# Patient Record
Sex: Male | Born: 1940 | Hispanic: Yes | Marital: Married | Smoking: Former smoker
Health system: Southern US, Community
[De-identification: ages and names within clinical notes are randomized; demographics above are authoritative.]

## PROBLEM LIST (undated history)

## (undated) DIAGNOSIS — M1990 Unspecified osteoarthritis, unspecified site: Secondary | ICD-10-CM

## (undated) DIAGNOSIS — E785 Hyperlipidemia, unspecified: Secondary | ICD-10-CM

## (undated) DIAGNOSIS — J449 Chronic obstructive pulmonary disease, unspecified: Secondary | ICD-10-CM

## (undated) DIAGNOSIS — I10 Essential (primary) hypertension: Secondary | ICD-10-CM

## (undated) HISTORY — DX: Essential (primary) hypertension: I10

## (undated) HISTORY — DX: Unspecified osteoarthritis, unspecified site: M19.90

## (undated) HISTORY — DX: Hyperlipidemia, unspecified: E78.5

## (undated) HISTORY — DX: Chronic obstructive pulmonary disease, unspecified: J44.9

## (undated) HISTORY — PX: TONSILLECTOMY: SUR1361

## (undated) HISTORY — PX: CHOLECYSTECTOMY: SHX55

---

## 2015-06-12 LAB — PULMONARY FUNCTION TEST

## 2016-07-05 LAB — PULMONARY FUNCTION TEST

## 2018-08-28 LAB — PULMONARY FUNCTION TEST

## 2019-10-05 LAB — HM COLONOSCOPY

## 2020-10-13 LAB — PULMONARY FUNCTION TEST

## 2021-01-23 ENCOUNTER — Encounter: Payer: Self-pay | Admitting: *Deleted

## 2021-01-23 DIAGNOSIS — I10 Essential (primary) hypertension: Secondary | ICD-10-CM | POA: Insufficient documentation

## 2021-01-23 DIAGNOSIS — E785 Hyperlipidemia, unspecified: Secondary | ICD-10-CM | POA: Insufficient documentation

## 2021-01-23 DIAGNOSIS — M1990 Unspecified osteoarthritis, unspecified site: Secondary | ICD-10-CM

## 2021-01-23 DIAGNOSIS — E7800 Pure hypercholesterolemia, unspecified: Secondary | ICD-10-CM

## 2021-01-23 DIAGNOSIS — J449 Chronic obstructive pulmonary disease, unspecified: Secondary | ICD-10-CM | POA: Insufficient documentation

## 2021-01-26 ENCOUNTER — Encounter: Payer: Self-pay | Admitting: Adult Health

## 2021-01-26 ENCOUNTER — Other Ambulatory Visit: Payer: Self-pay

## 2021-01-26 ENCOUNTER — Non-Acute Institutional Stay: Payer: Medicare Other | Admitting: Adult Health

## 2021-01-26 VITALS — BP 124/80 | HR 77 | Temp 97.4°F | Wt 220.6 lb

## 2021-01-26 DIAGNOSIS — M1990 Unspecified osteoarthritis, unspecified site: Secondary | ICD-10-CM

## 2021-01-26 DIAGNOSIS — E7800 Pure hypercholesterolemia, unspecified: Secondary | ICD-10-CM

## 2021-01-26 DIAGNOSIS — J449 Chronic obstructive pulmonary disease, unspecified: Secondary | ICD-10-CM | POA: Diagnosis not present

## 2021-01-26 DIAGNOSIS — I10 Essential (primary) hypertension: Secondary | ICD-10-CM

## 2021-01-26 DIAGNOSIS — R7303 Prediabetes: Secondary | ICD-10-CM | POA: Insufficient documentation

## 2021-01-26 DIAGNOSIS — Q6102 Congenital multiple renal cysts: Secondary | ICD-10-CM

## 2021-01-26 DIAGNOSIS — N400 Enlarged prostate without lower urinary tract symptoms: Secondary | ICD-10-CM

## 2021-01-26 DIAGNOSIS — R911 Solitary pulmonary nodule: Secondary | ICD-10-CM | POA: Diagnosis not present

## 2021-01-26 HISTORY — DX: Congenital multiple renal cysts: Q61.02

## 2021-01-26 HISTORY — DX: Solitary pulmonary nodule: R91.1

## 2021-01-26 HISTORY — DX: Prediabetes: R73.03

## 2021-01-26 NOTE — Progress Notes (Signed)
Location:  [facility]:  Clinic  Provider:  Cindi Carbon, [place] [phone]   Code Status: Full code  Goals of Care:  Advanced Directives 01/23/2021  Does Patient Have a Medical Advance Directive? Yes  Type of Paramedic of [place];Living will  Does patient want to make changes to medical advance directive? No - Patient declined     Chief Complaint  Patient presents with  . Medical Management of Chronic Issues    Patient here today to establish care.     HPI: Patient is a 80 y.o. male seen today for medical management of chronic diseases.    Moved into a [place] in [place] April 13th from [place] to live closer to family. He is here to establish care but has no acute complaints.   Wants to find a pulmonologist here. Stated that his prior pulmonologist had done a work up for worsening dyspnea/decreasing FEV1. He had an echo that was "normal" by his account. He was due for a sleep study next. He does report that he has been told he snores. He is not sleeping well since he moved to [place]. He is a former smoker 2 ppd x 44 years. Denies a cough or PND. Does have DOE as mentioned above. No wheezing. Does not need prn inhalers.  Also followed for a lung nodule (still need records)  BP well controlled.   Has BPH and wants his PSA checked. Symptoms controlled with flomax Has 3 cysts on his right kidney followed by Urology Dr. Sallee Lange   Has some chronic neck pain (cervical area with burning and pain at times in the shoulder area but no pain down the arm and no numbness, tingling or loss of function) and left knee pain due to arthritis and was told he would need a replacement one day. At home he takes ibuprofen as needed for relief.     Past Medical History:  Diagnosis Date  . Arthritis   . COPD (chronic obstructive pulmonary disease) ([place])   . Hyperlipidemia   . Hypertension   . Lung nodule  01/26/2021  . Multiple renal cysts 01/26/2021   Right kidney  . Prediabetes 01/26/2021    Past Surgical History:  Procedure Laterality Date  . CHOLECYSTECTOMY    . TONSILLECTOMY      Allergies  Allergen Reactions  . Sulfa Antibiotics     Outpatient Encounter Medications as of 01/26/2021  Medication Sig  . amLODipine (NORVASC) 10 MG tablet Take 10 mg by mouth daily.  Marland Kitchen aspirin EC 81 MG tablet Take 81 mg by mouth daily. Swallow whole.  Marland Kitchen atorvastatin (LIPITOR) 10 MG tablet Take 10 mg by mouth daily.  . finasteride (PROSCAR) 5 MG tablet Take 5 mg by mouth every other day.  . fluticasone furoate-vilanterol (BREO ELLIPTA) 100-25 MCG/INH AEPB Inhale 1 puff into the lungs daily.  . hydrochlorothiazide (MICROZIDE) 12.5 MG capsule Take 12.5 mg by mouth daily.  Marland Kitchen losartan (COZAAR) 100 MG tablet Take 100 mg by mouth daily.  . tamsulosin (FLOMAX) 0.4 MG CAPS capsule Take 0.4 mg by mouth daily.   No facility-administered encounter medications on file as of 01/26/2021.    Review of Systems:  Review of Systems  Constitutional: Negative for activity change, appetite change, chills, diaphoresis, fatigue, fever and unexpected weight change.  HENT: Positive for hearing loss. Negative for congestion.   Respiratory: Positive for shortness of breath (on exertion). Negative for cough, wheezing and  stridor.   Cardiovascular: Negative for chest pain, palpitations and leg swelling.  Gastrointestinal: Positive for constipation. Negative for abdominal distention, abdominal pain and diarrhea.  Genitourinary: Negative for difficulty urinating and dysuria.  Musculoskeletal: Positive for gait problem. Negative for back pain, joint swelling and myalgias.       Neck pain, left knee pain  Skin: Negative for wound.  Neurological: Negative for dizziness, seizures, syncope, facial asymmetry, speech difficulty, weakness and headaches.  Hematological: Negative for adenopathy. Does not bruise/bleed easily.   Psychiatric/Behavioral: Negative for agitation, behavioral problems and confusion.    Health Maintenance  Topic Date Due  . Hepatitis C Screening  Never done  . COVID-19 Vaccine (1) Never done  . TETANUS/TDAP  Never done  . PNA vac Low Risk Adult (1 of 2 - PCV13) Never done  . INFLUENZA VACCINE  05/04/2021  . HPV VACCINES  Aged Out    Physical Exam: Vitals:   01/26/21 1534  BP: 124/80  Pulse: 77  Temp: (!) 97.4 F (36.3 C)  SpO2: 93%  Weight: 220 lb 9.6 oz (100.1 kg)   There is no height or weight on file to calculate BMI. Physical Exam Vitals and nursing note reviewed.  Constitutional:      General: He is not in acute distress.    Appearance: He is not diaphoretic.  HENT:     Head: Normocephalic and atraumatic.     Right Ear: Tympanic membrane and ear canal normal.     Left Ear: Tympanic membrane and ear canal normal.     Nose: Nose normal. No congestion.     Mouth/Throat:     Mouth: Mucous membranes are moist.     Pharynx: Oropharynx is clear. No oropharyngeal exudate.  Eyes:     General:        Right eye: No discharge.        Left eye: No discharge.     Conjunctiva/sclera: Conjunctivae normal.     Pupils: Pupils are equal, round, and reactive to light.  Neck:     Thyroid: No thyromegaly.     Vascular: No JVD.     Trachea: No tracheal deviation.  Cardiovascular:     Rate and Rhythm: Normal rate and regular rhythm.     Heart sounds: No murmur heard.   Pulmonary:     Effort: Pulmonary effort is normal. No respiratory distress.     Breath sounds: Normal breath sounds. No wheezing.  Abdominal:     General: Bowel sounds are normal. There is no distension.     Palpations: Abdomen is soft.     Tenderness: There is no abdominal tenderness.  Musculoskeletal:        General: No swelling, tenderness, deformity or signs of injury.     Cervical back: Normal range of motion and neck supple.     Right lower leg: No edema.     Left lower leg: No edema.   Lymphadenopathy:     Cervical: No cervical adenopathy.  Skin:    General: Skin is warm and dry.  Neurological:     Mental Status: He is alert and oriented to person, place, and time.     Cranial Nerves: No cranial nerve deficit.  Psychiatric:        Mood and Affect: Mood normal.     Labs reviewed: Basic Metabolic Panel: No results for input(s): NA, K, CL, CO2, GLUCOSE, BUN, CREATININE, CALCIUM, MG, PHOS, TSH in the last 8760 hours. Liver Function Tests: No results for  input(s): AST, ALT, ALKPHOS, BILITOT, PROT, ALBUMIN in the last 8760 hours. No results for input(s): LIPASE, AMYLASE in the last 8760 hours. No results for input(s): AMMONIA in the last 8760 hours. CBC: No results for input(s): WBC, NEUTROABS, HGB, HCT, MCV, PLT in the last 8760 hours. Lipid Panel: No results for input(s): CHOL, HDL, LDLCALC, TRIG, CHOLHDL, LDLDIRECT in the last 8760 hours. No results found for: HGBA1C  Procedures since last visit: No results found.  Assessment/Plan  1. Lung nodule Reports he has periodic imaging to monitor this issue. Will need more records .  2. Multiple renal cysts Followed by urology with MRI on the right kidney  3. Chronic obstructive pulmonary disease, unspecified COPD type ([place]) Progressively worse DOE followed by pulmonary in [place], need records  - Ambulatory referral to Pulmonology  4. Hypertension, unspecified type Controlled Continue Norvasc 10 mg qd and Cozaar 100 mg qd, along with HCTZ Check BMP  5. Pure hypercholesterolemia Continue Lipitor Check lipids  6. Prediabetes Fasting glucose elevated at last check  Will reorder Will recommend weight loss with reduced carb diet  7. Benign prostatic hyperplasia without lower urinary tract symptoms No complaints with Flomax and Proscar  8. Arthritis Issues with his neck and left knee which are mild and respond to ibuprofen Report if symptoms worsen   Rec f/u with ophthalmology for routine  purposes with Dr. Ellie Lunch on campus   Labs/tests ordered:  Lipid, TSH CMP CBC PSA in am  Next appt:  04/29/2021

## 2021-01-26 NOTE — Patient Instructions (Addendum)
Dr. Ellie Lunch f/u with ophthalmology on campus  Referral to Pulmonary Chesley Mires for COPD/?sleep apnea  F/U with Dr. Lyndel Safe in Sept

## 2021-02-03 LAB — HEPATIC FUNCTION PANEL
ALT: 38 (ref 10–40)
AST: 24 (ref 14–40)
Alkaline Phosphatase: 120 (ref 25–125)
Bilirubin, Total: 0.6

## 2021-02-03 LAB — BASIC METABOLIC PANEL
BUN: 21 (ref 4–21)
CO2: 25 — AB (ref 13–22)
Chloride: 110 — AB (ref 99–108)
Creatinine: 1 (ref 0.6–1.3)
Glucose: 130
Potassium: 3.7 (ref 3.4–5.3)
Sodium: 147 (ref 137–147)

## 2021-02-03 LAB — COMPREHENSIVE METABOLIC PANEL
Albumin: 4.6 (ref 3.5–5.0)
Calcium: 9.5 (ref 8.7–10.7)
GFR calc Af Amer: 79.65
GFR calc non Af Amer: 68.72
Globulin: 2.5

## 2021-02-03 LAB — LIPID PANEL
Cholesterol: 131 (ref 0–200)
HDL: 32 — AB (ref 35–70)
LDL Cholesterol: 50
LDl/HDL Ratio: 4.1
Triglycerides: 247 — AB (ref 40–160)

## 2021-02-03 LAB — PSA: PSA: 3.47

## 2021-02-03 LAB — CBC: RBC: 5.96 — AB (ref 3.87–5.11)

## 2021-02-03 LAB — CBC AND DIFFERENTIAL
HCT: 53 (ref 41–53)
Hemoglobin: 17.8 — AB (ref 13.5–17.5)
Platelets: 160 (ref 150–399)
WBC: 7.7

## 2021-02-03 LAB — HEMOGLOBIN A1C: Hemoglobin A1C: 6.8

## 2021-02-03 LAB — TSH: TSH: 3.25 (ref 0.41–5.90)

## 2021-02-11 ENCOUNTER — Other Ambulatory Visit: Payer: Self-pay

## 2021-02-11 MED ORDER — HYDROCHLOROTHIAZIDE 12.5 MG PO CAPS: 12.5000 mg | ORAL_CAPSULE | Freq: Every day | ORAL | 3 refills | Status: DC

## 2021-02-11 MED ORDER — AMLODIPINE BESYLATE 10 MG PO TABS: 10.0000 mg | ORAL_TABLET | Freq: Every day | ORAL | 3 refills | Status: DC

## 2021-02-11 MED ORDER — FINASTERIDE 5 MG PO TABS: 5.0000 mg | ORAL_TABLET | ORAL | 3 refills | Status: DC

## 2021-02-11 MED ORDER — ATORVASTATIN CALCIUM 10 MG PO TABS: 10.0000 mg | ORAL_TABLET | Freq: Every day | ORAL | 3 refills | Status: DC

## 2021-02-11 MED ORDER — BREO ELLIPTA 100-25 MCG/INH IN AEPB: 1.0000 | INHALATION_SPRAY | Freq: Every day | RESPIRATORY_TRACT | 3 refills | Status: DC

## 2021-02-11 MED ORDER — LOSARTAN POTASSIUM 100 MG PO TABS: 100.0000 mg | ORAL_TABLET | Freq: Every day | ORAL | 1 refills | Status: DC

## 2021-02-12 ENCOUNTER — Other Ambulatory Visit (HOSPITAL_BASED_OUTPATIENT_CLINIC_OR_DEPARTMENT_OTHER): Payer: Self-pay

## 2021-02-12 ENCOUNTER — Other Ambulatory Visit: Payer: Self-pay

## 2021-02-12 ENCOUNTER — Ambulatory Visit: Payer: Self-pay | Attending: Internal Medicine

## 2021-02-12 DIAGNOSIS — Z23 Encounter for immunization: Secondary | ICD-10-CM

## 2021-02-12 MED ORDER — COVID-19 MRNA VACC (MODERNA) 100 MCG/0.5ML IM SUSP
INTRAMUSCULAR | 0 refills | Status: DC
  Filled 2021-02-12: qty 0.25, 1d supply, fill #0

## 2021-02-12 NOTE — Progress Notes (Signed)
   Covid-19 Vaccination Clinic  Name:  Guenther Dunshee    MRN: [id number] DOB: [birth year]  02/12/2021  Mr. Bail was observed post Covid-19 immunization for 15 minutes without incident. He was provided with Vaccine Information Sheet and instruction to access the V-Safe system.   Mr. Cockerell was instructed to call 911 with any severe reactions post vaccine: Marland Kitchen Difficulty breathing  . Swelling of face and throat  . A fast heartbeat  . A bad rash all over body  . Dizziness and weakness   Immunizations Administered    Name Date Dose VIS Date Route   Moderna Covid-19 Booster Vaccine 02/12/2021  9:30 AM 0.25 mL 07/23/2020 Intramuscular   Manufacturer: Moderna   Lot: [id number]   [place]: [id number]

## 2021-03-06 ENCOUNTER — Institutional Professional Consult (permissible substitution): Payer: Self-pay | Admitting: Pulmonary Disease

## 2021-03-26 ENCOUNTER — Other Ambulatory Visit: Payer: Self-pay | Admitting: *Deleted

## 2021-03-26 MED ORDER — TAMSULOSIN HCL 0.4 MG PO CAPS: 0.4000 mg | ORAL_CAPSULE | Freq: Every day | ORAL | 1 refills | Status: DC

## 2021-03-26 NOTE — Telephone Encounter (Signed)
Patient wife called requesting refill.  Pended Rx and sent to [facility] for approval due to [place].

## 2021-04-03 ENCOUNTER — Ambulatory Visit (INDEPENDENT_AMBULATORY_CARE_PROVIDER_SITE_OTHER): Payer: Medicare Other | Admitting: Pulmonary Disease

## 2021-04-03 ENCOUNTER — Encounter: Payer: Self-pay | Admitting: Pulmonary Disease

## 2021-04-03 ENCOUNTER — Other Ambulatory Visit: Payer: Self-pay

## 2021-04-03 VITALS — BP 124/80 | HR 90 | Temp 98.1°F | Ht 69.0 in | Wt 221.2 lb

## 2021-04-03 DIAGNOSIS — R911 Solitary pulmonary nodule: Secondary | ICD-10-CM | POA: Diagnosis not present

## 2021-04-03 DIAGNOSIS — J449 Chronic obstructive pulmonary disease, unspecified: Secondary | ICD-10-CM | POA: Diagnosis not present

## 2021-04-03 DIAGNOSIS — R0683 Snoring: Secondary | ICD-10-CM | POA: Diagnosis not present

## 2021-04-03 NOTE — Progress Notes (Signed)
[facility]  Chief Complaint  Patient presents with   Consult    Moved here from [place] about 3 months.  He did bring his records. Breathing has been better since he moved here.   He has started doing more exercise.      Constitutional:  BP 124/80   Pulse 90   Temp 98.1 F (36.7 C) (Oral)   Ht 5\' 9"  (1.753 m)   Wt 221 lb 4 oz (100.4 kg)   SpO2 91%   BMI 32.67 kg/m   Past Medical History:  HLD, HTN, Pre-DM  Past Surgical History:  He  has a past surgical history that includes Cholecystectomy and Tonsillectomy.  Brief Summary:  Steven Davies is a 80 y.o. male former smoker with COPD.       Subjective:   He moved from [place] in April.  Seen by pulmonary there for COPD.  He moved to be closer to family.  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  Smoked 2 ppd, but quit several years ago.  No history of pneumonia or TB.  No animal/bird exposures.  Hasn't needed prednisone or antibiotics for his breathing recently.  He has snoring, and is a restless sleeper.  Does get sleepy during the day at times.  He was to have sleep study in [place], but moved before this could get set up.  He gets winded walking up stairs or a hill.  No having cough, wheeze or sputum.  Feels his breathing is better since he moved to [place].  Had serial CT chest imaging for Lt lower lung nodule with question of whether this could be a hamartoma.  Last CT chest was in 2019.  No having fever, chest pain, weight loss, or hemoptysis.  Physical Exam:   Appearance - well kempt   ENMT - no sinus tenderness, no oral exudate, no LAN, Mallampati 3 airway, no stridor  Respiratory - equal breath sounds bilaterally, no wheezing or rales  CV - s1s2 regular rate and rhythm, no murmurs  Ext - no clubbing, no edema  Skin - no rashes  Psych - normal mood and affect   Pulmonary testing:  PFT 10/13/20 >> FEV1 1.90 (67%), FEV1% 56, TLC 5.87 (87%), DLCO  54%  Chest Imaging:  CT chest 07/26/18 >> LLL nodule was 14 mm in 2010 and now 2.5 mm, mild centrilobular emphysema  Sleep Tests:    Social History:  He  reports that he has quit smoking. His smoking use included cigarettes. He has a 88.00 pack-year smoking history. He has never used smokeless tobacco. He reports current alcohol use of about 3.0 standard drinks of alcohol per week. He reports that he does not use drugs.  Family History:  His family history includes Heart attack in his father; Heart failure in his brother and mother.     Assessment/Plan:   COPD with asthma. - continue Breo 200 one puff daily - prn albuterol  Snoring. - he has snoring, sleep disruption, apnea, and daytime sleepiness - he has history of hypertension  - he could have obstructive sleep apnea - will arrange for home sleep study to further assess  Lt lower lung nodule. - will arrange for CT chest w/o contrast to continue monitoring  Time Spent Involved in Patient Care on Day of Examination:  37 minutes  Follow up:   Patient Instructions  Will arrange for CT chest and home sleep study  Follow up in 2 months  Medication List:   Allergies as of 04/03/2021       Reactions   Sulfa Antibiotics         Medication List        Accurate as of April 03, 2021 11:35 AM. If you have any questions, ask your nurse or doctor.          amLODipine 10 MG tablet Commonly known as: NORVASC Take 1 tablet (10 mg total) by mouth daily.   aspirin EC 81 MG tablet Take 81 mg by mouth daily. Swallow whole.   atorvastatin 10 MG tablet Commonly known as: LIPITOR Take 1 tablet (10 mg total) by mouth daily.   finasteride 5 MG tablet Commonly known as: PROSCAR Take 1 tablet (5 mg total) by mouth every other day.   fluticasone furoate-vilanterol 200-25 MCG/INH Aepb Commonly known as: BREO ELLIPTA Inhale 1 puff into the lungs daily. What changed: Another medication with the same name was removed.  Continue taking this medication, and follow the directions you see here. Changed by: Chesley Mires, MD   hydrochlorothiazide 12.5 MG capsule Commonly known as: MICROZIDE Take 1 capsule (12.5 mg total) by mouth daily.   losartan 100 MG tablet Commonly known as: COZAAR Take 1 tablet (100 mg total) by mouth daily.   Moderna COVID-19 Vaccine 100 MCG/0.5ML injection Generic drug: COVID-19 mRNA vaccine (Moderna) Inject into the muscle.   tamsulosin 0.4 MG Caps capsule Commonly known as: FLOMAX Take 1 capsule (0.4 mg total) by mouth daily.        Signature:  Chesley Mires, MD [facility] Pager - [phone] 04/03/2021, 11:35 AM

## 2021-04-03 NOTE — Patient Instructions (Signed)
Will arrange for CT chest and home sleep study  Follow up in 2 months

## 2021-04-09 ENCOUNTER — Other Ambulatory Visit: Payer: Self-pay

## 2021-04-09 ENCOUNTER — Ambulatory Visit (HOSPITAL_COMMUNITY)
Admission: RE | Admit: 2021-04-09 | Discharge: 2021-04-09 | Disposition: A | Discharge status: Home / Self Care | Payer: Medicare Other | Source: Ambulatory Visit | Attending: Pulmonary Disease | Admitting: Pulmonary Disease

## 2021-04-09 DIAGNOSIS — R911 Solitary pulmonary nodule: Secondary | ICD-10-CM | POA: Insufficient documentation

## 2021-04-29 ENCOUNTER — Ambulatory Visit: Payer: Medicare Other | Admitting: Internal Medicine

## 2021-04-29 ENCOUNTER — Encounter: Payer: Self-pay | Admitting: Internal Medicine

## 2021-04-29 ENCOUNTER — Other Ambulatory Visit: Payer: Self-pay

## 2021-04-29 VITALS — BP 126/92 | HR 77 | Temp 97.3°F | Ht 69.0 in | Wt 220.8 lb

## 2021-04-29 DIAGNOSIS — J449 Chronic obstructive pulmonary disease, unspecified: Secondary | ICD-10-CM | POA: Diagnosis not present

## 2021-04-29 DIAGNOSIS — I6523 Occlusion and stenosis of bilateral carotid arteries: Secondary | ICD-10-CM

## 2021-04-29 DIAGNOSIS — I10 Essential (primary) hypertension: Secondary | ICD-10-CM

## 2021-04-29 DIAGNOSIS — R7303 Prediabetes: Secondary | ICD-10-CM

## 2021-04-29 DIAGNOSIS — N400 Enlarged prostate without lower urinary tract symptoms: Secondary | ICD-10-CM

## 2021-04-29 DIAGNOSIS — M542 Cervicalgia: Secondary | ICD-10-CM

## 2021-04-29 DIAGNOSIS — R911 Solitary pulmonary nodule: Secondary | ICD-10-CM | POA: Diagnosis not present

## 2021-04-29 DIAGNOSIS — E7800 Pure hypercholesterolemia, unspecified: Secondary | ICD-10-CM

## 2021-04-29 DIAGNOSIS — Q6102 Congenital multiple renal cysts: Secondary | ICD-10-CM | POA: Diagnosis not present

## 2021-04-29 NOTE — Progress Notes (Signed)
Location:      Place of Service:     Provider:   Code Status: Full Code Goals of Care:  Advanced Directives 04/29/2021  Does Patient Have a Medical Advance Directive? No  Type of Advance Directive -  Does patient want to make changes to medical advance directive? -  Would patient like information on creating a medical advance directive? No - Patient declined     Chief Complaint  Patient presents with   Medical Management of Chronic Issues    Patient returns to the clinic for his follow up.    Health Maintenance    TDAP, Shingrix, PCV13, #4 covid    HPI: Patient is a 80 y.o. male seen today for medical management of chronic diseases.   Recent Move from [place] with his wife  Active issues History of lung nodule pulmonary hamartoma With history of smoking he follows with pulmonology COPD.  Stable on Breo History of complicated renal cyst. Followed with Uro oncologist before Wants referal to Urology here Does have Prostate issues though symptoms controlled with Proscar  Was told by her Dentist after Scan that he has Carotid Blockage He does not have any history if TIA  Neck Pain Had Xrays done  in 2014 Showed DJD with Possible Spinal Compression Pain has gotten worse and wants to know if he needs further imaging No Numbness or weakness or any other Red Flag signs Just pain in Lower part of his neck  Hearing Loss Does not need hearing aid yet  Has bilateral Edema  HTN Overweight with Prediabetes Past Medical History:  Diagnosis Date   Arthritis    COPD (chronic obstructive pulmonary disease) ([place])    Hyperlipidemia    Hypertension    Lung nodule 01/26/2021   Multiple renal cysts 01/26/2021   Right kidney   Prediabetes 01/26/2021    Past Surgical History:  Procedure Laterality Date   CHOLECYSTECTOMY     TONSILLECTOMY      Allergies  Allergen Reactions   Sulfa Antibiotics     Outpatient Encounter Medications as of 04/29/2021  Medication Sig    amLODipine (NORVASC) 10 MG tablet Take 1 tablet (10 mg total) by mouth daily.   aspirin EC 81 MG tablet Take 81 mg by mouth daily. Swallow whole.   atorvastatin (LIPITOR) 10 MG tablet Take 1 tablet (10 mg total) by mouth daily.   COVID-19 mRNA vaccine, Moderna, 100 MCG/0.5ML injection Inject into the muscle.   finasteride (PROSCAR) 5 MG tablet Take 1 tablet (5 mg total) by mouth every other day.   fluticasone furoate-vilanterol (BREO ELLIPTA) 200-25 MCG/INH AEPB Inhale 1 puff into the lungs daily.   hydrochlorothiazide (MICROZIDE) 12.5 MG capsule Take 1 capsule (12.5 mg total) by mouth daily.   losartan (COZAAR) 100 MG tablet Take 1 tablet (100 mg total) by mouth daily.   tamsulosin (FLOMAX) 0.4 MG CAPS capsule Take 1 capsule (0.4 mg total) by mouth daily.   No facility-administered encounter medications on file as of 04/29/2021.    Review of Systems:  Review of Systems  All other systems reviewed and are negative. As above rest is all negative  Health Maintenance  Topic Date Due   Hepatitis C Screening  Never done   TETANUS/TDAP  Never done   Zoster Vaccines- Shingrix (1 of 2) Never done   PNA vac Low Risk Adult (1 of 2 - PCV13) Never done   INFLUENZA VACCINE  05/04/2021   COVID-19 Vaccine  Completed   HPV VACCINES  Aged Out    Physical Exam: Vitals:   04/29/21 1001  BP: (!) 126/92  Pulse: 77  Temp: (!) 97.3 F (36.3 C)  SpO2: 93%  Weight: 220 lb 12.8 oz (100.2 kg)  Height: '5\' 9"'$  (1.753 m)   Body mass index is 32.61 kg/m. Physical Exam Constitutional: Oriented to person, place, and time. Well-developed and well-nourished.  HENT:  Head: Normocephalic.  TM good No Wax Mouth/Throat: Oropharynx is clear and moist.  Eyes: Pupils are equal, round, and reactive to light.  Neck: Neck supple.  Cardiovascular: Normal rate and normal heart sounds.  No murmur heard. Pulmonary/Chest: Effort normal and breath sounds normal. No respiratory distress. No wheezes. She has no rales.   Abdominal: Soft. Bowel sounds are normal. No distension. There is no tenderness. There is no rebound.  Musculoskeletal: Mild Edema Bilateral  Lymphadenopathy: none Neurological: Alert and oriented to person, place, and time.  Stable gait No Deficits Skin: Skin is warm and dry.  Psychiatric: Normal mood and affect. Behavior is normal. Thought content normal.   Labs reviewed: Basic Metabolic Panel: Recent Labs    02/03/21 0300  NA 147  K 3.7  CL 110*  CO2 25*  BUN 21  CREATININE 1.0  CALCIUM 9.5  TSH 3.25   Liver Function Tests: Recent Labs    02/03/21 0300  AST 24  ALT 38  ALKPHOS 120  ALBUMIN 4.6   No results for input(s): LIPASE, AMYLASE in the last 8760 hours. No results for input(s): AMMONIA in the last 8760 hours. CBC: Recent Labs    02/03/21 0300  WBC 7.7  HGB 17.8*  HCT 53  PLT 160   Lipid Panel: Recent Labs    02/03/21 0300  CHOL 131  HDL 32*  LDLCALC 50  TRIG 247*   Lab Results  Component Value Date   HGBA1C 6.8 02/03/2021    Procedures since last visit: CT Chest Wo Contrast  Result Date: 04/10/2021 CLINICAL DATA:  Pulmonary nodule. Follow-up. 80 year old male reportedly based on previous imaging reports with history of papillary renal cell carcinoma. EXAM: CT CHEST WITHOUT CONTRAST TECHNIQUE: Multidetector CT imaging of the chest was performed following the standard protocol without IV contrast. COMPARISON:  Reports from previous imaging obtained at [facility], available in care everywhere. Images are not available for review. FINDINGS: Cardiovascular: Calcified atheromatous plaque of the thoracic aorta. No aneurysmal dilation. Normal heart size without pericardial effusion. Mildly engorged central pulmonary vasculature. Limited assessment of cardiovascular structures given lack of intravenous contrast. Mediastinum/Nodes: Thoracic inlet structures are unremarkable. No axillary lymphadenopathy. No internal mammary adenopathy. No mediastinal  adenopathy. No hilar adenopathy. Esophagus grossly normal. Lungs/Pleura: In the LEFT lung base is a well circumscribed 2.8 x 2.0 x 2.5 cm nodule reportedly measuring 1.7 x 1.9 cm but based on MRI report from 2017 and 2018. Additional reports are from previous MR studies but reference a previous chest CT and discussed dimensions up to 2.4 x 1.9 cm on the comparison study from 2021. There is measurable "macroscopic" fat within the lesion at -60 Hounsfield units on image 122 of series 5. No additional pulmonary nodules are noted. No effusion. No consolidation. Minimal scarring at the LEFT lung base. Airways are patent. Upper Abdomen: Hepatic steatosis with lobular hepatic contours and cyst in the LEFT hepatic lobe. Post cholecystectomy. Liver incompletely imaged. Imaged portions of pancreas, spleen and adrenal glands are unremarkable. Hyperdense lesion arising from the anterior hilar lip of the LEFT kidney measures 1.8 cm at 58 Hounsfield units  this measures approximately 1.8 cm greatest dimension. This could represent a hemorrhagic cyst but is incompletely characterized on the current study. There was a hemorrhagic cyst referenced in the LEFT kidney on a prior MR report. No upper abdominal lymphadenopathy Musculoskeletal: Spinal degenerative changes. No acute or destructive bone process. IMPRESSION: 1. Pulmonary hamartoma, fat containing pulmonary nodule, may have enlarged since previous imaging based on reports available. 2. Mildly hyperdense lesion arising from the anterior LEFT kidney may reflect a hemorrhagic cyst but is indeterminate based on CT. Correlation with previous MR may be helpful to determine whether this represents the hemorrhagic cyst referenced in the previous MRI report. 3. Hepatic steatosis. 4. Aortic atherosclerosis. Aortic Atherosclerosis (ICD10-I70.0). Electronically Signed   By: Zetta Bills M.D.   On: 04/10/2021 14:55    Assessment/Plan 1. Lung nodule Follows with Dr Halford Chessman with Q yearly  CT scan Has been stable so far Does have h/o Smoking 2. Multiple renal cysts Referal Made to [facility]  3. Chronic obstructive pulmonary disease, unspecified COPD type ([place]) Symptoms Controlled on Breo  4. Hypertension, unspecified type Stable on Norvasc and Cozaar and Microzide  5. Pure hypercholesterolemia On Statin  6. Benign prostatic hyperplasia without lower urinary tract symptoms On Flomax and Proscar  7. Prediabetes I d/w him about his A1C being elevated He will work on his diet and Exercise If continues to go higher consider Metformin  8. Neck pain Tylenol PRN Referal to Neurosurgery 9 ? Carotids Blockage  per his dentis [place] ordered 10 Bilateral Leg edema D/w him it is most likely due to High dose of Norvasc He does not want to change the dose right now  Labs/tests ordered:  * No order type specified * Next appt:  10/26/2021

## 2021-04-29 NOTE — Patient Instructions (Signed)
[facility] in [place]

## 2021-05-05 ENCOUNTER — Other Ambulatory Visit: Payer: Self-pay | Admitting: Surgical

## 2021-05-05 DIAGNOSIS — N281 Cyst of kidney, acquired: Secondary | ICD-10-CM

## 2021-05-06 ENCOUNTER — Ambulatory Visit: Payer: Medicare Other

## 2021-05-06 ENCOUNTER — Other Ambulatory Visit: Payer: Self-pay

## 2021-05-06 DIAGNOSIS — R0683 Snoring: Secondary | ICD-10-CM

## 2021-05-06 DIAGNOSIS — G4733 Obstructive sleep apnea (adult) (pediatric): Secondary | ICD-10-CM | POA: Diagnosis not present

## 2021-05-07 ENCOUNTER — Telehealth: Payer: Self-pay | Admitting: Pulmonary Disease

## 2021-05-07 DIAGNOSIS — G4733 Obstructive sleep apnea (adult) (pediatric): Secondary | ICD-10-CM | POA: Diagnosis not present

## 2021-05-07 NOTE — Telephone Encounter (Signed)
HST 05/06/21 >> AHI 13.4, SpO2 low 75%.  Spent 235.6 min with SpO2 < 89%.    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  Please arrange for ROV with me or NP to discuss treatment options.

## 2021-05-08 NOTE — Telephone Encounter (Signed)
Called and spoke with patient to let him know of HST results. He expressed understanding and is already scheduled with Dr. Halford Chessman. Advised him that we are going to keep that appointment. Nothing further needed at this time.   Next Appt With Pulmonology [facility] [place], MD)06/23/2021 at 10:15 AM

## 2021-06-02 ENCOUNTER — Other Ambulatory Visit: Payer: Self-pay | Admitting: Internal Medicine

## 2021-06-16 ENCOUNTER — Ambulatory Visit
Admission: RE | Admit: 2021-06-16 | Discharge: 2021-06-16 | Disposition: A | Discharge status: Home / Self Care | Payer: Medicare Other | Source: Ambulatory Visit | Attending: Surgical | Admitting: Surgical

## 2021-06-16 ENCOUNTER — Ambulatory Visit
Admission: RE | Admit: 2021-06-16 | Discharge: 2021-06-16 | Disposition: A | Discharge status: Home / Self Care | Payer: Medicare Other | Source: Ambulatory Visit | Attending: Internal Medicine | Admitting: Internal Medicine

## 2021-06-16 DIAGNOSIS — I6523 Occlusion and stenosis of bilateral carotid arteries: Secondary | ICD-10-CM

## 2021-06-16 DIAGNOSIS — N281 Cyst of kidney, acquired: Secondary | ICD-10-CM

## 2021-06-23 ENCOUNTER — Encounter: Payer: Self-pay | Admitting: Pulmonary Disease

## 2021-06-23 ENCOUNTER — Ambulatory Visit (INDEPENDENT_AMBULATORY_CARE_PROVIDER_SITE_OTHER): Payer: Medicare Other | Admitting: Pulmonary Disease

## 2021-06-23 ENCOUNTER — Other Ambulatory Visit: Payer: Self-pay

## 2021-06-23 VITALS — BP 132/78 | HR 101 | Temp 98.3°F | Ht 69.0 in | Wt 220.6 lb

## 2021-06-23 DIAGNOSIS — Q859 Phakomatosis, unspecified: Secondary | ICD-10-CM

## 2021-06-23 DIAGNOSIS — J449 Chronic obstructive pulmonary disease, unspecified: Secondary | ICD-10-CM

## 2021-06-23 DIAGNOSIS — G4733 Obstructive sleep apnea (adult) (pediatric): Secondary | ICD-10-CM

## 2021-06-23 DIAGNOSIS — R942 Abnormal results of pulmonary function studies: Secondary | ICD-10-CM

## 2021-06-23 DIAGNOSIS — I6523 Occlusion and stenosis of bilateral carotid arteries: Secondary | ICD-10-CM | POA: Diagnosis not present

## 2021-06-23 MED ORDER — FLUTICASONE FUROATE-VILANTEROL 200-25 MCG/INH IN AEPB: 1.0000 | INHALATION_SPRAY | Freq: Every day | RESPIRATORY_TRACT | 5 refills | Status: DC

## 2021-06-23 NOTE — Patient Instructions (Signed)
Will arrange for referral to Dr. Sandra Fuller to assess for an oral appliance to treat obstructive sleep apnea  Follow up in 6 months 

## 2021-06-23 NOTE — Progress Notes (Signed)
[facility]  Chief Complaint  Patient presents with   Follow-up    Sob slightly improving, had cold x 1,chest congestion, occass. Cough, no fcs    Constitutional:  BP 132/78 (BP Location: Right Arm, Cuff Size: Normal)   Pulse (!) 101   Temp 98.3 F (36.8 C) (Temporal)   Ht 5\' 9"  (1.753 m)   Wt 220 lb 9.6 oz (100.1 kg)   SpO2 93%   BMI 32.58 kg/m   Past Medical History:  HLD, HTN, Pre-DM  Past Surgical History:  He  has a past surgical history that includes Cholecystectomy and Tonsillectomy.  Brief Summary:  Steven Davies is a 80 y.o. male former smoker with COPD.       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  Home sleep study showed mild sleep apnea.  He developed a cold several days ago.  Was having scratchy sore throat.  This is better, but still has some runny nose.  Not having fever, chest pain, or wheeze.  He was told by his insurance that he needed to continue using breo.  Physical Exam:   Appearance - well kempt   ENMT - no sinus tenderness, no oral exudate, no LAN, Mallampati 4 airway, no stridor  Respiratory - equal breath sounds bilaterally, no wheezing or rales  CV - s1s2 regular rate and rhythm, no murmurs  Ext - no clubbing, no edema  Skin - no rashes  Psych - normal mood and affect    Pulmonary testing:  PFT 10/13/20 >> FEV1 1.90 (67%), FEV1% 56, TLC 5.87 (87%), DLCO 54%  Chest Imaging:  CT chest 07/26/18 >> LLL nodule was 14 mm in 2010 and now 2.5 mm, mild centrilobular emphysema CT chest 04/10/21 >> 2.8 x 2.0 x 2.5 cm Lt lung base hamartoma, fatty liver, atherosclerosis  Sleep Tests:  HST 05/06/21 >> AHI 13.4, SpO2 low 75%.  Spent 235.6 min with SpO2 < 89%.  Social History:  He  reports that he has quit smoking. His smoking use included cigarettes. He has a 88.00 pack-year smoking history. He has never used smokeless tobacco. He reports current alcohol use of about 3.0  standard drinks per week. He reports that he does not use drugs.  Family History:  His family history includes Heart attack in his father; Heart failure in his brother and mother.     Assessment/Plan:   COPD with asthma. - continue breo 200 one puff daily - prn albuterol  Obstructive sleep apnea. - reviewed his sleep study - discussed how sleep apnea can impact his health - treatment options discussed - will arrange for referral to Dr. Augustina Mood to assess for oral appliance to treat obstructive sleep apnea  Obesity. 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 - uncertain significance - no evidence for interstitial lung disease or emphysema - monitor clinically and repeat breathing test if symptoms progress  Time Spent Involved in Patient Care on Day of Examination:  34 minutes  Follow up:   Patient Instructions  Will arrange for referral to Dr. Augustina Mood to assess for an oral appliance to treat obstructive sleep apnea.  Follow up in 6 months  Medication List:   Allergies as of 06/23/2021       Reactions   Sulfa Antibiotics         Medication List  Accurate as of June 23, 2021 11:55 AM. If you have any questions, ask your nurse or doctor.          amLODipine 10 MG tablet Commonly known as: NORVASC Take 1 tablet (10 mg total) by mouth daily.   aspirin EC 81 MG tablet Take 81 mg by mouth daily. Swallow whole.   atorvastatin 10 MG tablet Commonly known as: LIPITOR Take 1 tablet (10 mg total) by mouth daily.   finasteride 5 MG tablet Commonly known as: PROSCAR Take 1 tablet (5 mg total) by mouth every other day.   fluticasone furoate-vilanterol 200-25 MCG/INH Aepb Commonly known as: BREO ELLIPTA Inhale 1 puff into the lungs daily. What changed: when to take this Changed by: Chesley Mires, MD   hydrochlorothiazide 12.5 MG capsule Commonly  known as: MICROZIDE Take 1 capsule (12.5 mg total) by mouth daily.   losartan 100 MG tablet Commonly known as: COZAAR TAKE 1 TABLET BY MOUTH EVERY DAY   Moderna COVID-19 Vaccine 100 MCG/0.5ML injection Generic drug: COVID-19 mRNA vaccine (Moderna) Inject into the muscle.   tamsulosin 0.4 MG Caps capsule Commonly known as: FLOMAX Take 1 capsule (0.4 mg total) by mouth daily.        Signature:  Chesley Mires, MD [place] Pager - [phone] 06/23/2021, 11:55 AM

## 2021-07-01 ENCOUNTER — Encounter: Payer: Self-pay | Admitting: Internal Medicine

## 2021-08-20 ENCOUNTER — Other Ambulatory Visit: Payer: Self-pay | Admitting: Internal Medicine

## 2021-09-20 ENCOUNTER — Other Ambulatory Visit: Payer: Self-pay | Admitting: Adult Health

## 2021-09-25 ENCOUNTER — Other Ambulatory Visit: Payer: Self-pay | Admitting: Internal Medicine

## 2021-09-25 ENCOUNTER — Other Ambulatory Visit: Payer: Self-pay | Admitting: Orthopedic Surgery

## 2021-09-25 ENCOUNTER — Telehealth: Payer: Self-pay

## 2021-09-25 DIAGNOSIS — U071 COVID-19: Secondary | ICD-10-CM

## 2021-09-25 MED ORDER — NIRMATRELVIR/RITONAVIR (PAXLOVID) TABLET (RENAL DOSING): 2.0000 | ORAL_TABLET | Freq: Two times a day (BID) | ORAL | 0 refills | Status: AC

## 2021-09-25 NOTE — Telephone Encounter (Signed)
Patient's wife called and reports that he tested positive for COVID on today, 09/25/21. She would like something send into the pharmacy for patient and reports mild symptoms at the moment. She was advised that patient may need appointment before medication can be send into pharmacy. Please advise.

## 2021-09-25 NOTE — Telephone Encounter (Signed)
Treatment options discussed with wife. Prescription sent in for Paxlovid.

## 2021-09-25 NOTE — Telephone Encounter (Signed)
[facility] patient. Patient's wife called again Upset that nothing has been called in and stated that her husband tested Positive today with Covid with mild symptoms but she is requesting Paxlovid to be called to the pharmacy.   Forwarded message to Dr. Lyndel Safe and Amy.

## 2021-09-25 NOTE — Progress Notes (Signed)
Tested positive today. Symptoms include cough and sore throat. Discussed treatment options. Would like prescription for paxlovid. Also recommended vitamin C 1000 mg po daily x 7 days, vitamin D 2000 units daily x 7 days and zinc 50 mg daily x 7 days. CDC quarantining guidelines discussed. UTD on all covid vaccines.

## 2021-10-07 ENCOUNTER — Other Ambulatory Visit: Payer: Self-pay

## 2021-10-07 MED ORDER — FLUTICASONE FUROATE-VILANTEROL 200-25 MCG/ACT IN AEPB: 1.0000 | INHALATION_SPRAY | Freq: Every day | RESPIRATORY_TRACT | 2 refills | Status: DC

## 2021-10-13 LAB — PSA: PSA: 3.05

## 2021-10-14 LAB — HEPATIC FUNCTION PANEL
ALT: 43 — AB (ref 10–40)
AST: 36 (ref 14–40)
Alkaline Phosphatase: 149 — AB (ref 25–125)
Bilirubin, Total: 0.6

## 2021-10-14 LAB — BASIC METABOLIC PANEL
BUN: 28 — AB (ref 4–21)
CO2: 17 (ref 13–22)
Chloride: 105 (ref 99–108)
Creatinine: 1.1 (ref 0.6–1.3)
Glucose: 188
Potassium: 4 (ref 3.4–5.3)
Sodium: 152 — AB (ref 137–147)

## 2021-10-14 LAB — CBC: RBC: 6.24 — AB (ref 3.87–5.11)

## 2021-10-14 LAB — LIPID PANEL
Cholesterol: 124 (ref 0–200)
HDL: 26 — AB (ref 35–70)
LDl/HDL Ratio: 4.7
Triglycerides: 420 — AB (ref 40–160)

## 2021-10-14 LAB — COMPREHENSIVE METABOLIC PANEL
Albumin: 4.7 (ref 3.5–5.0)
Calcium: 9.4 (ref 8.7–10.7)
Globulin: 2.8

## 2021-10-14 LAB — CBC AND DIFFERENTIAL
HCT: 56 — AB (ref 41–53)
Hemoglobin: 18.1 — AB (ref 13.5–17.5)
Platelets: 184 (ref 150–399)
WBC: 9.6

## 2021-10-14 LAB — HEMOGLOBIN A1C: Hemoglobin A1C: 7.7

## 2021-10-26 ENCOUNTER — Encounter: Payer: Self-pay | Admitting: Adult Health

## 2021-10-26 ENCOUNTER — Non-Acute Institutional Stay: Payer: Medicare Other | Admitting: Adult Health

## 2021-10-26 ENCOUNTER — Other Ambulatory Visit: Payer: Self-pay

## 2021-10-26 VITALS — BP 136/94 | HR 99 | Temp 98.1°F | Ht 69.0 in | Wt 223.0 lb

## 2021-10-26 DIAGNOSIS — Q6102 Congenital multiple renal cysts: Secondary | ICD-10-CM

## 2021-10-26 DIAGNOSIS — J449 Chronic obstructive pulmonary disease, unspecified: Secondary | ICD-10-CM

## 2021-10-26 DIAGNOSIS — G4733 Obstructive sleep apnea (adult) (pediatric): Secondary | ICD-10-CM

## 2021-10-26 DIAGNOSIS — E7800 Pure hypercholesterolemia, unspecified: Secondary | ICD-10-CM

## 2021-10-26 DIAGNOSIS — G4730 Sleep apnea, unspecified: Secondary | ICD-10-CM | POA: Insufficient documentation

## 2021-10-26 DIAGNOSIS — I10 Essential (primary) hypertension: Secondary | ICD-10-CM | POA: Diagnosis not present

## 2021-10-26 DIAGNOSIS — M542 Cervicalgia: Secondary | ICD-10-CM

## 2021-10-26 DIAGNOSIS — E669 Obesity, unspecified: Secondary | ICD-10-CM

## 2021-10-26 DIAGNOSIS — E119 Type 2 diabetes mellitus without complications: Secondary | ICD-10-CM

## 2021-10-26 DIAGNOSIS — R911 Solitary pulmonary nodule: Secondary | ICD-10-CM

## 2021-10-26 MED ORDER — HYDROCHLOROTHIAZIDE 12.5 MG PO CAPS: 12.5000 mg | ORAL_CAPSULE | ORAL | 3 refills | Status: DC

## 2021-10-26 NOTE — Assessment & Plan Note (Signed)
Monitor by renal ultrasound per urology.

## 2021-10-26 NOTE — Assessment & Plan Note (Signed)
LDL 50  On Lipitor Slight elevation in LFT and Alk phos Will recheck at next visit.

## 2021-10-26 NOTE — Assessment & Plan Note (Signed)
•   CT chest 04/10/21 >> 2.8 x 2.0 x 2.5 cm Lt lung base hamartoma, fatty liver, atherosclerosis Followed by pulmonary

## 2021-10-26 NOTE — Assessment & Plan Note (Signed)
Followed by Pulmonary Continue Memory Dance

## 2021-10-26 NOTE — Assessment & Plan Note (Signed)
Followed by Dr Halford Chessman  Using oral appliance.

## 2021-10-26 NOTE — Progress Notes (Addendum)
Location:  [facility]  Provider:  Cindi Carbon, [place] [phone]   Code Status:  Goals of Care:  Advanced Directives 04/29/2021  Does Patient Have a Medical Advance Directive? No  Type of Advance Directive -  Does patient want to make changes to medical advance directive? -  Would patient like information on creating a medical advance directive? No - Patient declined     Chief Complaint  Patient presents with   Medical Management of Chronic Issues    Patient returns to the clinic for 6 month follow up.    Quality Metric Gaps    Hepatitis C Screening (Once) TETANUS/TDAP (Every 10 Years) Zoster Vaccines- Shingrix      HPI: Patient is a 81 y.o. male seen today for medical management of chronic diseases.   PMH significant for HTN, former smoker COPD, arthritis, renal cysts, BPH, prediabetes, HLD  Labs 1/10 showed NA of 152 Fasting glucose of 188 Alk phos 149 ALT 43 AST 36 BUN 27.7 Cr 1.11 Treated for Covid 12/23 with paxlovid.  Has mild sleep apnea per note by Dr Halford Chessman. He was recommended to work with Augustina Mood for an oral appliance. Not able to wear it now because he is having dental surgery soon.   A1C 7.7 10/13/21, was 6.8 in May 2022.    PSA 3.05  Not currently exercising  Reports his neck pain is still present but does not radiate down his arm. He is taking advil 1-2 per week. Holding off on MRI due to dental surgery issues.  Past Medical History:  Diagnosis Date   Arthritis    COPD (chronic obstructive pulmonary disease) ([facility])    Hyperlipidemia    Hypertension    Lung nodule 01/26/2021   Multiple renal cysts 01/26/2021   Right kidney   Prediabetes 01/26/2021    Past Surgical History:  Procedure Laterality Date   CHOLECYSTECTOMY     TONSILLECTOMY      Allergies  Allergen Reactions   Sulfa Antibiotics     Outpatient Encounter Medications as of 10/26/2021  Medication Sig   amLODipine (NORVASC) 10 MG tablet Take 1  tablet (10 mg total) by mouth daily.   aspirin EC 81 MG tablet Take 81 mg by mouth daily. Swallow whole.   atorvastatin (LIPITOR) 10 MG tablet TAKE 1 TABLET BY MOUTH EVERY DAY   COVID-19 mRNA vaccine, Moderna, 100 MCG/0.5ML injection Inject into the muscle.   finasteride (PROSCAR) 5 MG tablet TAKE 1 TABLET BY MOUTH EVERY OTHER DAY   fluticasone furoate-vilanterol (BREO ELLIPTA) 200-25 MCG/ACT AEPB Inhale 1 puff into the lungs daily.   losartan (COZAAR) 100 MG tablet TAKE 1 TABLET BY MOUTH EVERY DAY   tamsulosin (FLOMAX) 0.4 MG CAPS capsule TAKE 1 CAPSULE BY MOUTH EVERY DAY   [DISCONTINUED] hydrochlorothiazide (MICROZIDE) 12.5 MG capsule Take 1 capsule (12.5 mg total) by mouth daily.   hydrochlorothiazide (MICROZIDE) 12.5 MG capsule Take 1 capsule (12.5 mg total) by mouth every other day.   No facility-administered encounter medications on file as of 10/26/2021.    Review of Systems:  Review of Systems  Constitutional:  Negative for activity change, appetite change, chills, diaphoresis, fatigue, fever and unexpected weight change.  Respiratory:  Positive for shortness of breath. Negative for cough, wheezing and stridor.   Cardiovascular:  Negative for chest pain, palpitations and leg swelling.  Gastrointestinal:  Negative for abdominal distention, abdominal pain, constipation and diarrhea.  Genitourinary:  Negative for difficulty urinating and dysuria.  Musculoskeletal:  Positive for back pain and neck pain. Negative for arthralgias, gait problem, joint swelling and myalgias.  Neurological:  Negative for dizziness, seizures, syncope, facial asymmetry, speech difficulty, weakness and headaches.  Hematological:  Negative for adenopathy. Does not bruise/bleed easily.  Psychiatric/Behavioral:  Negative for agitation, behavioral problems and confusion.    Health Maintenance  Topic Date Due   FOOT EXAM  Never done   OPHTHALMOLOGY EXAM  Never done   Hepatitis C Screening  Never done    TETANUS/TDAP  Never done   Zoster Vaccines- Shingrix (1 of 2) Never done   COVID-19 Vaccine (4 - Booster for Moderna series) 12/03/2021 (Originally 09/11/2021)   HEMOGLOBIN A1C  04/13/2022   Pneumonia Vaccine 67+ Years old  Completed   INFLUENZA VACCINE  Completed   HPV VACCINES  Aged Out    Physical Exam: Vitals:   10/26/21 1345  BP: (!) 136/94  Pulse: 99  Temp: 98.1 F (36.7 C)  SpO2: 92%  Weight: 223 lb (101.2 kg)  Height: 5' 9"  (1.753 m)   Body mass index is 32.93 kg/m. Physical Exam Vitals reviewed.  Constitutional:      General: He is not in acute distress.    Appearance: He is not diaphoretic.  HENT:     Head: Normocephalic and atraumatic.  Neck:     Thyroid: No thyromegaly.     Vascular: No JVD.     Trachea: No tracheal deviation.  Cardiovascular:     Rate and Rhythm: Normal rate and regular rhythm.     Heart sounds: No murmur heard. Pulmonary:     Effort: Pulmonary effort is normal. No respiratory distress.     Breath sounds: Normal breath sounds. No wheezing.  Abdominal:     General: Bowel sounds are normal. There is no distension.     Palpations: Abdomen is soft.     Tenderness: There is no abdominal tenderness.  Musculoskeletal:     Cervical back: No rigidity or tenderness.  Lymphadenopathy:     Cervical: No cervical adenopathy.  Skin:    General: Skin is warm and dry.  Neurological:     Mental Status: He is alert and oriented to person, place, and time.     Cranial Nerves: No cranial nerve deficit.    Labs reviewed: Basic Metabolic Panel: Recent Labs    02/03/21 0300 10/14/21 0000  NA 147 152*  K 3.7 4.0  CL 110* 105  CO2 25* 17  BUN 21 28*  CREATININE 1.0 1.1  CALCIUM 9.5 9.4  TSH 3.25  --    Liver Function Tests: Recent Labs    02/03/21 0300 10/14/21 0000  AST 24 36  ALT 38 43*  ALKPHOS 120 149*  ALBUMIN 4.6 4.7   No results for input(s): LIPASE, AMYLASE in the last 8760 hours. No results for input(s): AMMONIA in the last  8760 hours. CBC: Recent Labs    02/03/21 0300 10/14/21 0000  WBC 7.7 9.6  HGB 17.8* 18.1*  HCT 53 56*  PLT 160 184   Lipid Panel: Recent Labs    02/03/21 0300 10/14/21 0000  CHOL 131 124  HDL 32* 26*  LDLCALC 50  --   TRIG 247* 420*   Lab Results  Component Value Date   HGBA1C 7.7 10/14/2021    Procedures since last visit: No results found.  Assessment/Plan  Hypertension Reduce HCTZ to QOD due to elevated NA.  Will repeat BMP at next visit. Pt has no symptoms.  Hydrate Monitor bp at  home  COPD (chronic obstructive pulmonary disease) (HCC) Followed by Pulmonary Continue Breo  Hyperlipidemia LDL 50  On Lipitor Slight elevation in LFT and Alk phos Will recheck at next visit.   Sleep apnea Followed by Dr Halford Chessman  Using oral appliance.   Type 2 diabetes mellitus (HCC) A1C rising Counseled on risk of MI and CVA Recommend increase exercise as tolerated Reduce carbs/sweets/portion Recommend metformin but he would like to try lifestyle mods first.   Multiple renal cysts Monitor by renal ultrasound per urology.  Lung nodule CT chest 04/10/21 >> 2.8 x 2.0 x 2.5 cm Lt lung base hamartoma, fatty liver, atherosclerosis Followed by pulmonary   Obesity BMI 32.93  See above  Neck pain No radicular symptoms Holding off on MRI per pt Using advil sparingly  Labs/tests ordered:  * No order type specified * CBC CMP TSH A1C Next appt:  3 months   Recommend eye exam    Total time 64mn:  time greater than 50% of total time spent doing pt counseling and coordination of care

## 2021-10-26 NOTE — Assessment & Plan Note (Signed)
Reduce HCTZ to QOD due to elevated NA.  Will repeat BMP at next visit. Pt has no symptoms.  Hydrate Monitor bp at home

## 2021-10-26 NOTE — Patient Instructions (Signed)
Try to exercise 21min 4-5 times per week as tolerated  Reduce carbs and sugars due to elevated blood sugar  Reduce HCTZ to every other day  See the eye doc each year due to diabetes

## 2021-10-26 NOTE — Assessment & Plan Note (Signed)
A1C rising Counseled on risk of MI and CVA Recommend increase exercise as tolerated Reduce carbs/sweets/portion Recommend metformin but he would like to try lifestyle mods first.

## 2021-11-30 ENCOUNTER — Other Ambulatory Visit: Payer: Self-pay | Admitting: *Deleted

## 2021-11-30 MED ORDER — FINASTERIDE 5 MG PO TABS: 5.0000 mg | ORAL_TABLET | ORAL | 3 refills | Status: DC

## 2021-11-30 NOTE — Telephone Encounter (Signed)
Pharmacy requested refill

## 2021-12-01 ENCOUNTER — Other Ambulatory Visit: Payer: Self-pay | Admitting: Internal Medicine

## 2022-01-14 LAB — COMPREHENSIVE METABOLIC PANEL
Albumin: 4.3 (ref 3.5–5.0)
Calcium: 9.7 (ref 8.7–10.7)
Globulin: 2.8

## 2022-01-14 LAB — CBC AND DIFFERENTIAL
HCT: 54 — AB (ref 41–53)
Hemoglobin: 18.3 — AB (ref 13.5–17.5)
Platelets: 170 10*3/uL (ref 150–400)
WBC: 8.6

## 2022-01-14 LAB — BASIC METABOLIC PANEL
BUN: 27 — AB (ref 4–21)
CO2: 24 — AB (ref 13–22)
Chloride: 107 (ref 99–108)
Creatinine: 1 (ref 0.6–1.3)
Glucose: 128
Potassium: 4 mEq/L (ref 3.5–5.1)
Sodium: 145 (ref 137–147)

## 2022-01-14 LAB — HEPATIC FUNCTION PANEL
ALT: 35 U/L (ref 10–40)
AST: 28 (ref 14–40)
Alkaline Phosphatase: 111 (ref 25–125)
Bilirubin, Total: 0.6

## 2022-01-14 LAB — HEMOGLOBIN A1C: Hemoglobin A1C: 7.2

## 2022-01-14 LAB — CBC: RBC: 6.06 — AB (ref 3.87–5.11)

## 2022-01-14 LAB — TSH: TSH: 2.43 (ref 0.41–5.90)

## 2022-01-20 ENCOUNTER — Encounter: Payer: Medicare Other | Admitting: Internal Medicine

## 2022-01-27 ENCOUNTER — Non-Acute Institutional Stay: Payer: Medicare Other | Admitting: Internal Medicine

## 2022-01-27 ENCOUNTER — Encounter: Payer: Self-pay | Admitting: Internal Medicine

## 2022-01-27 VITALS — BP 118/78 | HR 88 | Temp 98.1°F | Ht 69.0 in | Wt 220.2 lb

## 2022-01-27 DIAGNOSIS — I10 Essential (primary) hypertension: Secondary | ICD-10-CM

## 2022-01-27 DIAGNOSIS — J449 Chronic obstructive pulmonary disease, unspecified: Secondary | ICD-10-CM | POA: Diagnosis not present

## 2022-01-27 DIAGNOSIS — E669 Obesity, unspecified: Secondary | ICD-10-CM

## 2022-01-27 DIAGNOSIS — Q6102 Congenital multiple renal cysts: Secondary | ICD-10-CM

## 2022-01-27 DIAGNOSIS — G4733 Obstructive sleep apnea (adult) (pediatric): Secondary | ICD-10-CM

## 2022-01-27 DIAGNOSIS — E119 Type 2 diabetes mellitus without complications: Secondary | ICD-10-CM

## 2022-01-27 DIAGNOSIS — R911 Solitary pulmonary nodule: Secondary | ICD-10-CM

## 2022-01-27 DIAGNOSIS — E7800 Pure hypercholesterolemia, unspecified: Secondary | ICD-10-CM

## 2022-01-27 DIAGNOSIS — R718 Other abnormality of red blood cells: Secondary | ICD-10-CM

## 2022-01-27 MED ORDER — METFORMIN HCL 500 MG PO TABS: 500.0000 mg | ORAL_TABLET | Freq: Two times a day (BID) | ORAL | 3 refills | Status: DC

## 2022-01-28 NOTE — Progress Notes (Signed)
? ?Location:  [facility] ?  ?Place of Service:  Clinic (12) ? ?Provider:  ? ?Code Status: Full Code ?Goals of Care:  ? ?  01/27/2022  ?  8:44 AM  ?Advanced Directives  ?Does Patient Have a Medical Advance Directive? No  ?Would patient like information on creating a medical advance directive? No - Patient declined  ? ? ? ?Chief Complaint  ?Patient presents with  ? Medical Management of Chronic Issues  ?  Patient returns to the clinic for 3 month follow up and discuss labs.  ? Quality Metric Gaps  ?  Verified matrix and [facility] patient due for Hep C screen, TDAP and shingrix  ? ? ?HPI: Patient is a 81 y.o. male seen today for medical management of chronic diseases.   ? ?Recent Move from [place] with his wife ? ? ?  ?Active issues ?History of lung nodule pulmonary hamartoma ?With history of smoking he follows with pulmonology ?COPD.  Stable on Breo ?Does get SOB when climb the hill or Hike ? ?History of complicated renal cyst. ?Followed with Uro oncologist before ?Was seen by Urology here. Do not see their records ?Does have Prostate issues though symptoms controlled with Proscar ?  ?Was told by her Dentist after Scan that he has Carotid Blockage ?He does not have any history if TIA ? 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 ?  ?Hearing Loss ?Does not need hearing aid yet ?  ?Has bilateral Edema  ?HTN ?Overweight with Diabetes ?A1C has improved ?He is watching what he eats ? ? ?Past Medical History:  ?Diagnosis Date  ? Arthritis   ? COPD (chronic obstructive pulmonary disease) ([facility])   ? Hyperlipidemia   ? Hypertension   ? Lung nodule 01/26/2021  ? Multiple renal cysts 01/26/2021  ? Right kidney  ? Prediabetes 01/26/2021  ? ? ?Past Surgical History:  ?Procedure Laterality Date  ? CHOLECYSTECTOMY    ? TONSILLECTOMY    ? ? ?Allergies  ?Allergen Reactions  ? Sulfa Antibiotics   ? ? ?Outpatient Encounter Medications as of  01/27/2022  ?Medication Sig  ? amLODipine (NORVASC) 10 MG tablet Take 1 tablet (10 mg total) by mouth daily.  ? aspirin EC 81 MG tablet Take 81 mg by mouth daily. Swallow whole.  ? atorvastatin (LIPITOR) 10 MG tablet TAKE 1 TABLET BY MOUTH EVERY DAY  ? finasteride (PROSCAR) 5 MG tablet Take 1 tablet (5 mg total) by mouth every other day.  ? fluticasone furoate-vilanterol (BREO ELLIPTA) 200-25 MCG/ACT AEPB Inhale 1 puff into the lungs daily.  ? hydrochlorothiazide (MICROZIDE) 12.5 MG capsule Take 1 capsule (12.5 mg total) by mouth every other day.  ? losartan (COZAAR) 100 MG tablet TAKE 1 TABLET BY MOUTH EVERY DAY  ? metFORMIN (GLUCOPHAGE) 500 MG tablet Take 1 tablet (500 mg total) by mouth 2 (two) times daily with a meal.  ? tamsulosin (FLOMAX) 0.4 MG CAPS capsule TAKE 1 CAPSULE BY MOUTH EVERY DAY  ? [DISCONTINUED] COVID-19 mRNA vaccine, Moderna, 100 MCG/0.5ML injection Inject into the muscle.  ? ?No facility-administered encounter medications on file as of 01/27/2022.  ? ? ?Review of Systems:  ?Review of Systems  ?Constitutional:  Negative for activity change, appetite change and unexpected weight change.  ?HENT: Negative.    ?Respiratory:  Negative for cough and shortness of breath.   ?Cardiovascular:  Negative for leg swelling.  ?Gastrointestinal:  Negative for constipation.  ?Genitourinary:  Negative for frequency.  ?Musculoskeletal:  Negative for arthralgias, gait problem and myalgias.  ?Skin: Negative.  Negative for rash.  ?Neurological:  Negative for dizziness and weakness.  ?Psychiatric/Behavioral:  Negative for confusion and sleep disturbance.   ?All other systems reviewed and are negative. ? ?Health Maintenance  ?Topic Date Due  ? Hepatitis C Screening  Never done  ? TETANUS/TDAP  Never done  ? Zoster Vaccines- Shingrix (1 of 2) Never done  ? COVID-19 Vaccine (4 - Booster for Moderna series) 08/25/2022 (Originally 09/11/2021)  ? INFLUENZA VACCINE  05/04/2022  ? HEMOGLOBIN A1C  07/16/2022  ? OPHTHALMOLOGY EXAM   12/28/2022  ? FOOT EXAM  01/28/2023  ? Pneumonia Vaccine 45+ Years old  Completed  ? HPV VACCINES  Aged Out  ? ? ?Physical Exam: ?Vitals:  ? 01/27/22 0840  ?BP: 118/78  ?Pulse: 88  ?Temp: 98.1 ?F (36.7 ?C)  ?SpO2: 92%  ?Weight: 220 lb 3.2 oz (99.9 kg)  ?Height: '5\' 9"'$  (1.753 m)  ? ?Body mass index is 32.52 kg/m?Marland Kitchen ?Physical Exam ?Vitals reviewed.  ?Constitutional:   ?   Appearance: Normal appearance.  ?HENT:  ?   Head: Normocephalic.  ?   Nose: Nose normal.  ?   Mouth/Throat:  ?   Mouth: Mucous membranes are moist.  ?   Pharynx: Oropharynx is clear.  ?Eyes:  ?   Pupils: Pupils are equal, round, and reactive to light.  ?Cardiovascular:  ?   Rate and Rhythm: Normal rate and regular rhythm.  ?   Pulses: Normal pulses.  ?   Heart sounds: No murmur heard. ?Pulmonary:  ?   Effort: Pulmonary effort is normal. No respiratory distress.  ?   Breath sounds: Normal breath sounds. No rales.  ?Abdominal:  ?   General: Abdomen is flat. Bowel sounds are normal.  ?   Palpations: Abdomen is soft.  ?Musculoskeletal:     ?   General: No swelling.  ?   Cervical back: Neck supple.  ?   Comments: Foot Exam Bilateral ?No Swelling ?DP Palpable ?No Sensory Loss  ?Skin: ?   General: Skin is warm.  ?Neurological:  ?   General: No focal deficit present.  ?   Mental Status: He is alert and oriented to person, place, and time.  ?Psychiatric:     ?   Mood and Affect: Mood normal.     ?   Thought Content: Thought content normal.  ? ? ?Labs reviewed: ?Basic Metabolic Panel: ?Recent Labs  ?  02/03/21 ?0300 10/14/21 ?0000 01/14/22 ?0000  ?NA 147 152* 145  ?K 3.7 4.0 4.0  ?CL 110* 105 107  ?CO2 25* 17 24*  ?BUN 21 28* 27*  ?CREATININE 1.0 1.1 1.0  ?CALCIUM 9.5 9.4 9.7  ?TSH 3.25  --  2.43  ? ?Liver Function Tests: ?Recent Labs  ?  02/03/21 ?0300 10/14/21 ?0000 01/14/22 ?0000  ?AST 24 36 28  ?ALT 38 43* 35  ?ALKPHOS 120 149* 111  ?ALBUMIN 4.6 4.7 4.3  ? ?No results for input(s): LIPASE, AMYLASE in the last 8760 hours. ?No results for input(s): AMMONIA in  the last 8760 hours. ?CBC: ?Recent Labs  ?  02/03/21 ?0300 10/14/21 ?0000 01/14/22 ?0000  ?WBC 7.7 9.6 8.6  ?HGB 17.8* 18.1* 18.3*  ?HCT 53 56* 54*  ?PLT 160 184 170  ? ?Lipid Panel: ?Recent Labs  ?  02/03/21 ?0300 10/14/21 ?0000  ?CHOL 131 124  ?HDL 32* 26*  ?LDLCALC 50  --   ?TRIG 247*  420*  ? ?Lab Results  ?Component Value Date  ? HGBA1C 7.2 01/14/2022  ? ? ?Procedures since last visit: ?No results found. ? ?Assessment/Plan ?1. Type 2 diabetes mellitus without complication, without long-term current use of insulin ([facility]) ?Will start him on Metformin 500 mg BID ?Continues diet and exercise ? ?2. Obstructive sleep apnea syndrome ?Is going to work with his [facility] ?Does have device which needs Adjusting ? ?3. Chronic obstructive pulmonary disease, unspecified COPD type ([facility]) ?On Breo ?Follows with Pulmonary ? ?4. Primary hypertension ?Doing well with Low Dose of HCTZ and Norvasc ? ?5. Pure hypercholesterolemia ?On Statin ?Will Need Follow up of his Lipids ? ?6. Multiple renal cysts ?Now Following with Urology ? ?7. Lung nodule ?Follows with Pulmonary ? ?8. Obesity (BMI 30-39.9) ?Starting Metformin will help with his weight ?9 Elevated Hematocrit ?Most likely due to his COPD and Sleep apnea ?Will follow  ? ? ?Labs/tests ordered:  * No order type specified * ?Next appt:  01/27/2022 ? ? ? ? ?

## 2022-02-20 NOTE — Progress Notes (Signed)
Location:  [facility]  POS: Clinic  Provider: Royal Hawthorn, ANP  Code Status:  Goals of Care:     02/22/2022    3:25 PM  Advanced Directives  Does Patient Have a Medical Advance Directive? No  Would patient like information on creating a medical advance directive? No - Patient declined     Chief Complaint  Patient presents with   Medical Management of Chronic Issues    Patient returns to the clinic for his one month up and discuss update with the Metformin.    Quality Metric Gaps    Discuss need for Shingrix and Hep C screen. Patient believes he has had the TDAP 2013.    HPI: Patient is a 81 y.o. male seen today for medical management of chronic diseases.    He was started on metformin for Type 2 DM 01/27/22 A1C 01/14/22 7.2, 10/14/21 7.7  Working on exercise and weight loss Weight 220 lbs 01/27/22, down 5 lbs in the past month.    Reports small change in stool consistency which is softer. Otherwise no changes.   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   aspirin EC 81 MG tablet Take 81 mg by mouth daily. Swallow whole.   atorvastatin (LIPITOR) 10 MG tablet TAKE 1 TABLET BY MOUTH EVERY DAY   finasteride (PROSCAR) 5 MG tablet Take 1 tablet (5 mg total) by mouth every other day.   fluticasone furoate-vilanterol (BREO ELLIPTA) 200-25 MCG/ACT AEPB Inhale 1 puff into the lungs daily.   hydrochlorothiazide (MICROZIDE) 12.5 MG capsule Take 1 capsule (12.5 mg total) by mouth every  other day.   losartan (COZAAR) 100 MG tablet TAKE 1 TABLET BY MOUTH EVERY DAY   metFORMIN (GLUCOPHAGE) 500 MG tablet Take 1 tablet (500 mg total) by mouth 2 (two) times daily with a meal.   tamsulosin (FLOMAX) 0.4 MG CAPS capsule TAKE 1 CAPSULE BY MOUTH EVERY DAY   No facility-administered encounter medications on file as of 02/22/2022.    Review of Systems:  Review of Systems  Constitutional:  Negative for activity change, appetite change, chills, diaphoresis, fatigue, fever and unexpected weight change.  Gastrointestinal:  Negative for abdominal distention, abdominal pain, constipation, diarrhea, nausea and vomiting.  Endocrine: Negative for polydipsia, polyphagia and polyuria.   Health Maintenance  Topic Date Due   Hepatitis C Screening  Never done   TETANUS/TDAP  Never done   Zoster Vaccines- Shingrix (1 of 2) Never done   COVID-19 Vaccine (4 - Booster for Moderna series) 08/25/2022 (Originally 09/11/2021)   INFLUENZA VACCINE  05/04/2022   HEMOGLOBIN A1C  07/16/2022   OPHTHALMOLOGY EXAM  12/28/2022   FOOT EXAM  01/28/2023   Pneumonia Vaccine 63+ Years old  Completed   HPV VACCINES  Aged Out    Physical Exam: Vitals:   02/22/22 1526  BP: (!) 124/92  Pulse: 86  Temp: 98.1 F (36.7 C)  SpO2: 91%  Weight:  215 lb 9.6 oz (97.8 kg)  Height: '5\' 9"'$  (1.753 m)   Body mass index is 31.84 kg/m. Wt Readings from Last 3 Encounters:  02/22/22 215 lb 9.6 oz (97.8 kg)  01/27/22 220 lb 3.2 oz (99.9 kg)  10/26/21 223 lb (101.2 kg)    Physical Exam Vitals reviewed.  Constitutional:      General: He is not in acute distress.    Appearance: He is not diaphoretic.  HENT:     Head: Normocephalic and atraumatic.  Neck:     Thyroid: No thyromegaly.     Vascular: No JVD.     Trachea: No tracheal deviation.  Cardiovascular:     Rate and Rhythm: Normal rate and regular rhythm.     Heart sounds: No murmur heard. Pulmonary:     Effort: Pulmonary effort is normal. No respiratory  distress.     Breath sounds: Normal breath sounds. No wheezing.  Abdominal:     General: Bowel sounds are normal. There is no distension.     Palpations: Abdomen is soft.     Tenderness: There is no abdominal tenderness.  Lymphadenopathy:     Cervical: No cervical adenopathy.  Skin:    General: Skin is warm and dry.  Neurological:     Mental Status: He is alert and oriented to person, place, and time.     Cranial Nerves: No cranial nerve deficit.    Labs reviewed: Basic Metabolic Panel: Recent Labs    10/14/21 0000 01/14/22 0000  NA 152* 145  K 4.0 4.0  CL 105 107  CO2 17 24*  BUN 28* 27*  CREATININE 1.1 1.0  CALCIUM 9.4 9.7  TSH  --  2.43   Liver Function Tests: Recent Labs    10/14/21 0000 01/14/22 0000  AST 36 28  ALT 43* 35  ALKPHOS 149* 111  ALBUMIN 4.7 4.3   No results for input(s): LIPASE, AMYLASE in the last 8760 hours. No results for input(s): AMMONIA in the last 8760 hours. CBC: Recent Labs    10/14/21 0000 01/14/22 0000  WBC 9.6 8.6  HGB 18.1* 18.3*  HCT 56* 54*  PLT 184 170   Lipid Panel: Recent Labs    10/14/21 0000  CHOL 124  HDL 26*  TRIG 420*   Lab Results  Component Value Date   HGBA1C 7.2 01/14/2022    Procedures since last visit: No results found.  Assessment/Plan  1. Type 2 diabetes mellitus with hyperglycemia, without long-term current use of insulin (HCC) Doing well on metformin bid '500mg'$  Has been losing weight Continue and recheck A1C at next visit  2. BMI 31.0-31.9,adult Continue to encourage healthy weight loss   Labs/tests ordered:  * No order type specified *A1C BMP Next appt:  F/U 3 months  Recommend tdap and shingles vaccine  Total time 46mn  time greater than 50% of total time spent doing pt counseling and coordination of care

## 2022-02-22 ENCOUNTER — Non-Acute Institutional Stay: Payer: Medicare Other | Admitting: Adult Health

## 2022-02-22 ENCOUNTER — Encounter: Payer: Self-pay | Admitting: Adult Health

## 2022-02-22 VITALS — BP 124/92 | HR 86 | Temp 98.1°F | Ht 69.0 in | Wt 215.6 lb

## 2022-02-22 DIAGNOSIS — E1165 Type 2 diabetes mellitus with hyperglycemia: Secondary | ICD-10-CM | POA: Diagnosis not present

## 2022-02-22 DIAGNOSIS — Z6831 Body mass index (BMI) 31.0-31.9, adult: Secondary | ICD-10-CM

## 2022-02-22 NOTE — Patient Instructions (Signed)
Recommend t/dap and shingles vaccine

## 2022-03-26 ENCOUNTER — Other Ambulatory Visit: Payer: Self-pay | Admitting: Internal Medicine

## 2022-03-31 ENCOUNTER — Other Ambulatory Visit: Payer: Self-pay | Admitting: Internal Medicine

## 2022-03-31 NOTE — Telephone Encounter (Signed)
Patient has request refill on medication Tamsulosin 0.'4mg'$ . Patient medication last refilled 09/21/2021. Medication has Allergy Contraindication. Medication pend and sent to PCP Virgie Dad, MD

## 2022-04-12 ENCOUNTER — Other Ambulatory Visit: Payer: Self-pay | Admitting: Internal Medicine

## 2022-04-21 ENCOUNTER — Other Ambulatory Visit: Payer: Self-pay | Admitting: Internal Medicine

## 2022-04-21 ENCOUNTER — Other Ambulatory Visit: Payer: Self-pay | Admitting: *Deleted

## 2022-04-21 MED ORDER — AMLODIPINE BESYLATE 10 MG PO TABS: 10.0000 mg | ORAL_TABLET | Freq: Every day | ORAL | 1 refills | Status: DC

## 2022-04-21 MED ORDER — FLUTICASONE FUROATE-VILANTEROL 200-25 MCG/ACT IN AEPB: 1.0000 | INHALATION_SPRAY | Freq: Every day | RESPIRATORY_TRACT | 2 refills | Status: DC

## 2022-04-21 NOTE — Telephone Encounter (Signed)
Patient requested refill

## 2022-04-23 ENCOUNTER — Other Ambulatory Visit: Payer: Self-pay

## 2022-04-23 MED ORDER — FLUTICASONE FUROATE-VILANTEROL 200-25 MCG/ACT IN AEPB: 1.0000 | INHALATION_SPRAY | Freq: Every day | RESPIRATORY_TRACT | 0 refills | Status: DC

## 2022-04-23 NOTE — Telephone Encounter (Signed)
Patient has request refill on medication Breo Ellipta 200-25MCG. He states that medication is usually give all at once for 90 day supply. However this time medication was broken up into refills. Patient medication sent back into pharmacy as requested.

## 2022-05-20 LAB — BASIC METABOLIC PANEL
BUN: 22 — AB (ref 4–21)
CO2: 24 — AB (ref 13–22)
Chloride: 107 (ref 99–108)
Creatinine: 1.1 (ref 0.6–1.3)
Glucose: 146
Potassium: 3.9 mEq/L (ref 3.5–5.1)
Sodium: 145 (ref 137–147)

## 2022-05-20 LAB — COMPREHENSIVE METABOLIC PANEL
Calcium: 9.3 (ref 8.7–10.7)
eGFR: 72

## 2022-05-20 LAB — HEMOGLOBIN A1C: Hemoglobin A1C: 6.9

## 2022-05-20 LAB — PSA: PSA: 3.5

## 2022-05-24 ENCOUNTER — Encounter: Payer: Self-pay | Admitting: Internal Medicine

## 2022-05-24 ENCOUNTER — Other Ambulatory Visit: Payer: Self-pay | Admitting: Urology

## 2022-05-24 DIAGNOSIS — D49512 Neoplasm of unspecified behavior of left kidney: Secondary | ICD-10-CM

## 2022-05-26 ENCOUNTER — Encounter: Payer: Self-pay | Admitting: Internal Medicine

## 2022-05-26 ENCOUNTER — Non-Acute Institutional Stay: Payer: Medicare Other | Admitting: Internal Medicine

## 2022-05-26 VITALS — BP 132/88 | HR 65 | Temp 97.7°F | Ht 69.0 in | Wt 214.6 lb

## 2022-05-26 DIAGNOSIS — Q6102 Congenital multiple renal cysts: Secondary | ICD-10-CM

## 2022-05-26 DIAGNOSIS — E7800 Pure hypercholesterolemia, unspecified: Secondary | ICD-10-CM

## 2022-05-26 DIAGNOSIS — E1165 Type 2 diabetes mellitus with hyperglycemia: Secondary | ICD-10-CM

## 2022-05-26 DIAGNOSIS — G4733 Obstructive sleep apnea (adult) (pediatric): Secondary | ICD-10-CM

## 2022-05-26 DIAGNOSIS — I10 Essential (primary) hypertension: Secondary | ICD-10-CM

## 2022-05-26 DIAGNOSIS — D751 Secondary polycythemia: Secondary | ICD-10-CM

## 2022-05-26 DIAGNOSIS — Z6831 Body mass index (BMI) 31.0-31.9, adult: Secondary | ICD-10-CM

## 2022-05-26 DIAGNOSIS — J449 Chronic obstructive pulmonary disease, unspecified: Secondary | ICD-10-CM

## 2022-05-28 NOTE — Progress Notes (Signed)
Location:  [place] of Service:  Clinic (12)  Provider:   Code Status: Full code Goals of Care:     02/22/2022    3:25 PM  Advanced Directives  Does Patient Have a Medical Advance Directive? No  Would patient like information on creating a medical advance directive? No - Patient declined     Chief Complaint  Patient presents with   Medical Management of Chronic Issues    Medical management: 3 month follow up    HPI: Patient is a 81 y.o. male seen today for medical management of chronic diseases.     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  Stable on Breo Does get SOB when climb the hill or Hike   History of complicated renal cyst. 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  Hearing Loss Does not need hearing aid yet  Has bilateral Edema  HTN Overweight with Diabetes   Lost some weight on Metformin  Having some diarrhea on metformin so cannot change the dose yet  Goes to the Swimming pool with his wife and enjoying exercises   Past Medical History:  Diagnosis Date   Arthritis    COPD (chronic obstructive pulmonary disease) ([place])    Hyperlipidemia    Hypertension    Lung nodule 01/26/2021   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   aspirin EC 81 MG tablet Take 81 mg by mouth daily. Swallow whole.   atorvastatin (LIPITOR) 10 MG tablet TAKE 1 TABLET BY MOUTH EVERY DAY   finasteride (PROSCAR)  5 MG tablet TAKE 1 TABLET BY MOUTH EVERY OTHER DAY   fluticasone furoate-vilanterol (BREO ELLIPTA) 200-25 MCG/ACT AEPB Inhale 1 puff into the lungs daily.   hydrochlorothiazide (MICROZIDE) 12.5 MG capsule Take 1 capsule (12.5 mg total) by mouth every other day.   losartan (COZAAR) 100 MG tablet TAKE 1 TABLET BY MOUTH EVERY DAY   metFORMIN (GLUCOPHAGE) 500 MG tablet Take 1 tablet (500 mg total) by mouth 2 (two) times daily with a meal.   tamsulosin (FLOMAX) 0.4 MG CAPS capsule TAKE 1 CAPSULE BY MOUTH EVERY DAY   No facility-administered encounter medications on file as of 05/26/2022.    Review of Systems:  Review of Systems  Constitutional:  Negative for activity change, appetite change and unexpected weight change.  HENT: Negative.    Respiratory:  Negative for cough and shortness of breath.   Gastrointestinal:  Positive for diarrhea. Negative for constipation.  Genitourinary:  Negative for frequency.  Musculoskeletal:  Negative for arthralgias, gait problem and myalgias.  Skin: Negative.  Negative for rash.  Neurological:  Negative for dizziness and weakness.  Psychiatric/Behavioral:  Negative for confusion and sleep disturbance.   All other systems reviewed and are negative.   Health Maintenance  Topic Date Due   Hepatitis C Screening  Never done   TETANUS/TDAP  Never done   Zoster Vaccines- Shingrix (1 of  2) Never done   INFLUENZA VACCINE  05/04/2022   COVID-19 Vaccine (4 - Moderna series) 08/25/2022 (Originally 09/11/2021)   HEMOGLOBIN A1C  11/20/2022   OPHTHALMOLOGY EXAM  12/28/2022   FOOT EXAM  01/28/2023   Pneumonia Vaccine 15+ Years old  Completed   HPV VACCINES  Aged Out    Physical Exam: Vitals:   05/26/22 1018  BP: 132/88  Pulse: 65  Temp: 97.7 F (36.5 C)  TempSrc: Skin  SpO2: 97%  Weight: 214 lb 9.6 oz (97.3 kg)  Height: '5\' 9"'$  (1.753 m)   Body mass index is 31.69 kg/m. Physical Exam Vitals reviewed.  Constitutional:      Appearance: Normal appearance.   HENT:     Head: Normocephalic.     Nose: Nose normal.     Mouth/Throat:     Mouth: Mucous membranes are moist.     Pharynx: Oropharynx is clear.  Eyes:     Pupils: Pupils are equal, round, and reactive to light.  Cardiovascular:     Rate and Rhythm: Normal rate and regular rhythm.     Pulses: Normal pulses.     Heart sounds: No murmur heard. Pulmonary:     Effort: Pulmonary effort is normal. No respiratory distress.     Breath sounds: Normal breath sounds. No rales.  Abdominal:     General: Abdomen is flat. Bowel sounds are normal.     Palpations: Abdomen is soft.  Musculoskeletal:        General: No swelling.     Cervical back: Neck supple.  Skin:    General: Skin is warm.  Neurological:     General: No focal deficit present.     Mental Status: He is alert and oriented to person, place, and time.  Psychiatric:        Mood and Affect: Mood normal.        Thought Content: Thought content normal.     Labs reviewed: Basic Metabolic Panel: Recent Labs    10/14/21 0000 01/14/22 0000 05/20/22 0000  NA 152* 145 145  K 4.0 4.0 3.9  CL 105 107 107  CO2 17 24* 24*  BUN 28* 27* 22*  CREATININE 1.1 1.0 1.1  CALCIUM 9.4 9.7 9.3  TSH  --  2.43  --    Liver Function Tests: Recent Labs    10/14/21 0000 01/14/22 0000  AST 36 28  ALT 43* 35  ALKPHOS 149* 111  ALBUMIN 4.7 4.3   No results for input(s): "LIPASE", "AMYLASE" in the last 8760 hours. No results for input(s): "AMMONIA" in the last 8760 hours. CBC: Recent Labs    10/14/21 0000 01/14/22 0000  WBC 9.6 8.6  HGB 18.1* 18.3*  HCT 56* 54*  PLT 184 170   Lipid Panel: Recent Labs    10/14/21 0000  CHOL 124  HDL 26*  TRIG 420*   Lab Results  Component Value Date   HGBA1C 6.9 05/20/2022    Procedures since last visit: No results found.  Assessment/Plan 1. Type 2 diabetes mellitus with hyperglycemia, without long-term current use of insulin (HCC) Some diarrhea on Metformin A1C less then 7  2.  BMI 31.0-31.9,adult Trying to loose weight  3. Chronic obstructive pulmonary disease, unspecified COPD type ([place]) On Breo Follows with Pulmonary  4. Primary hypertension On Norvasc and Cozaar and HCTZ  5. Erythrocytosis D/w patient  He said he has had it for long time and was worked up by his Previous Pulmonary I see some worsening  Has Appointment with Pulmonary to discuss ? His Apnea at night as he is wearing only mouth piece  As Prescribed by his dentist Gets up at night due to issus with his prostrate Repeat Labs Consider hematology referral next visit 6. Obstructive sleep apnea syndrome Follow up with Pulmonary   7. Pure hypercholesterolemia On statin HDL low Repeat levels  8. Multiple renal cysts Follows with Urology 9 BPH On Proscar nut does not take it QD due to ED issues   Labs/tests ordered:  CBC,CMP,A1C, Lipid  Next appt:  10/11/2022

## 2022-06-16 IMAGING — US US CAROTID DUPLEX BILAT
1 series · 13 of 24 positions shown · non-contrast
Comparison: None.

CLINICAL DATA: Bilateral carotid artery stenosis. History of
hypertension and hyperlipidemia. Former smoker.

EXAM:
BILATERAL CAROTID DUPLEX ULTRASOUND
TECHNIQUE: Gray scale imaging, color Doppler and duplex ultrasound were
performed of bilateral carotid and vertebral arteries in the neck.

[Series 1: us carotid duplex bilat · 0.06mm/px · 13 of 69 slices shown]
[im 1/69]
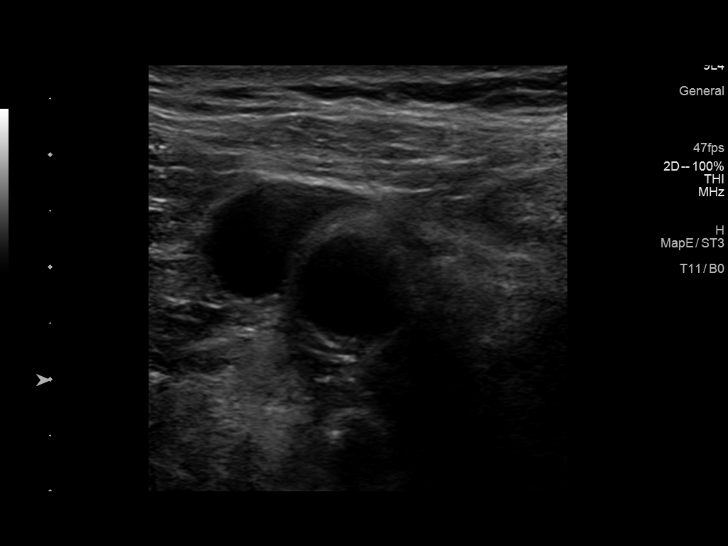
[im 6/69]
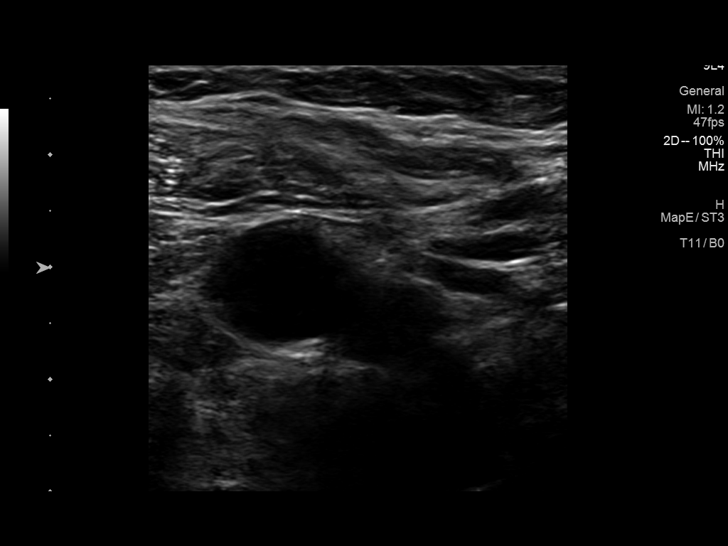
[im 12/69]
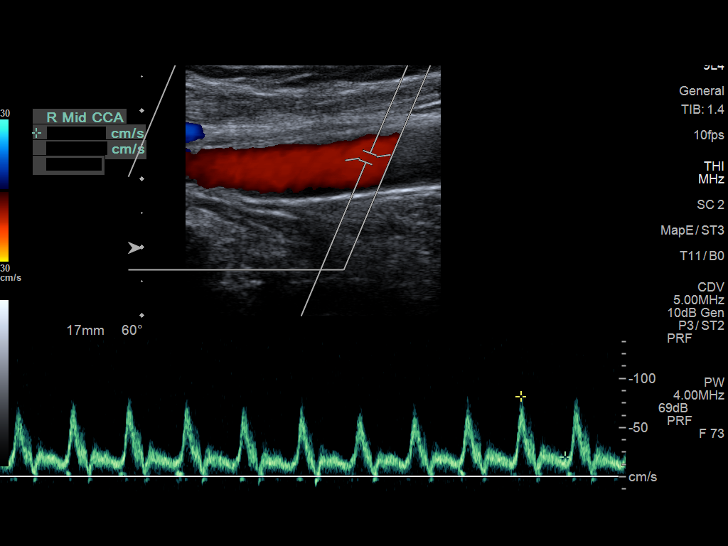
[im 18/69]
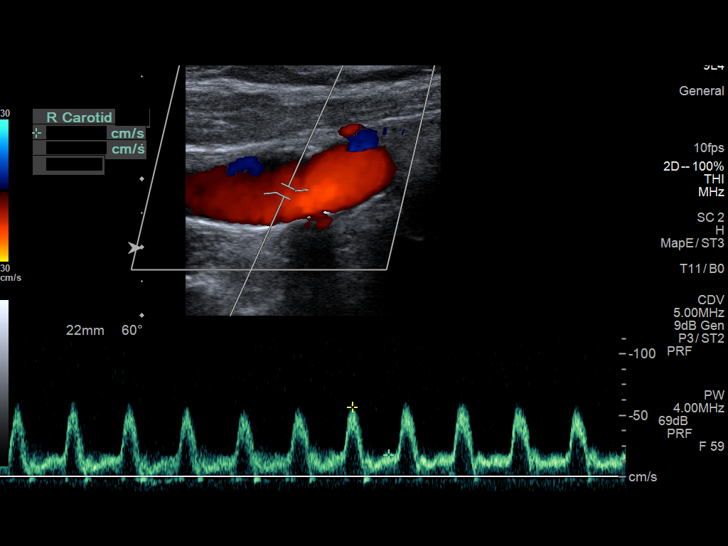
[im 24/69]
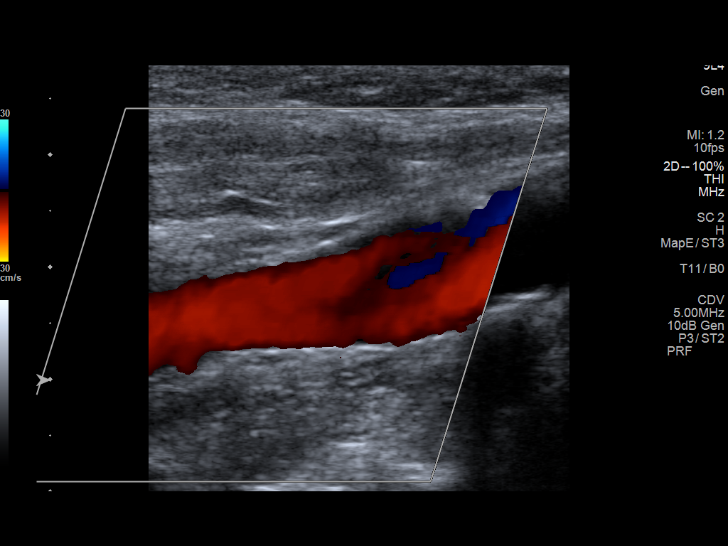
[im 30/69]
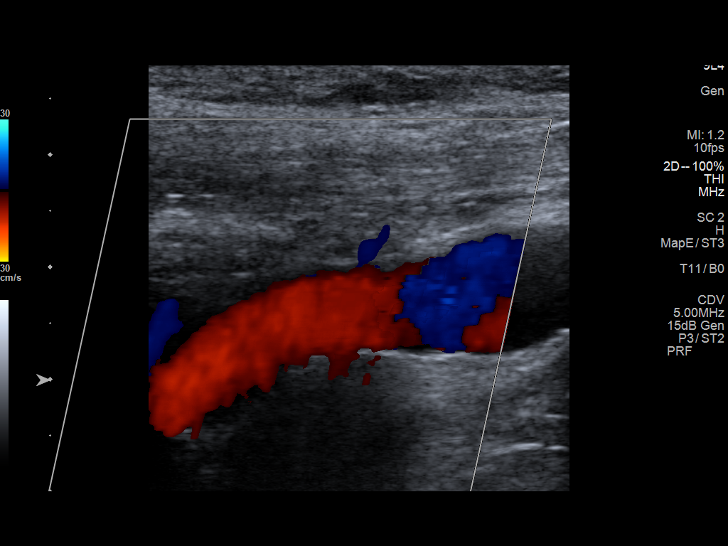
[im 36/69]
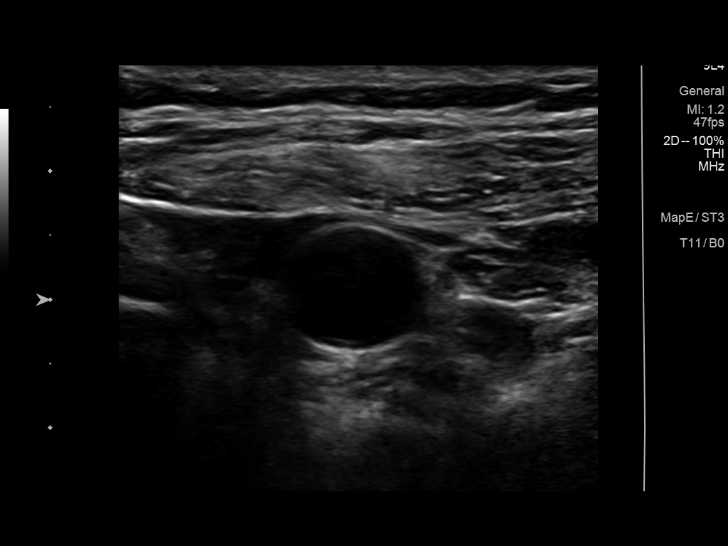
[im 39/69]
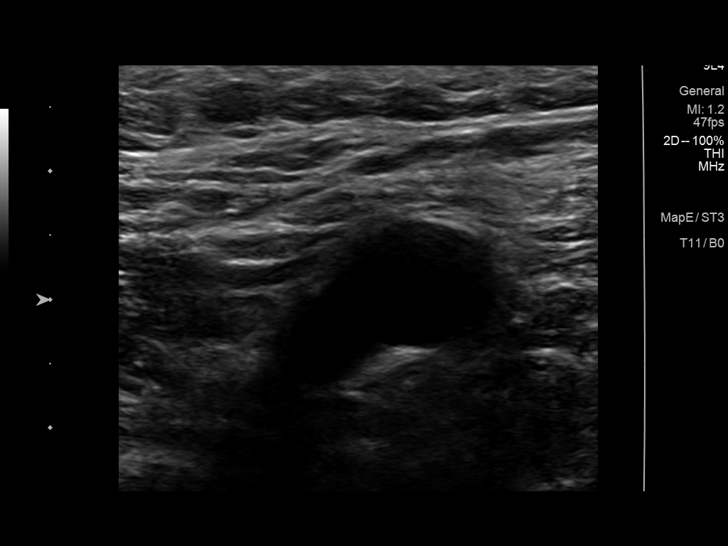
[im 45/69]
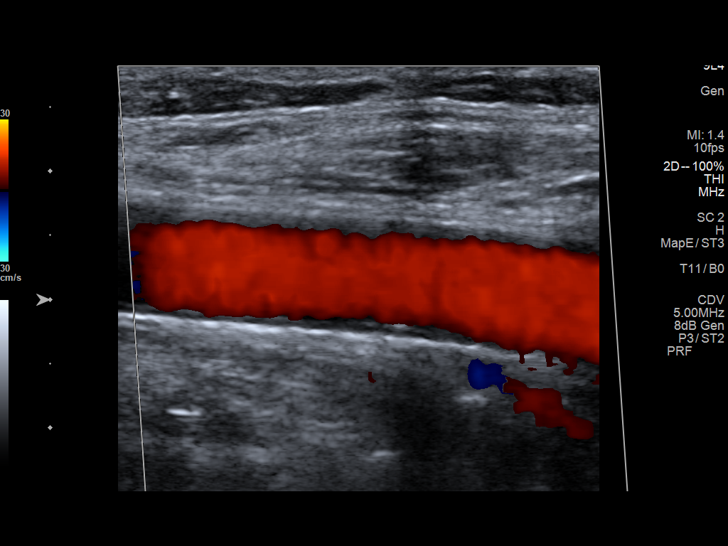
[im 51/69]
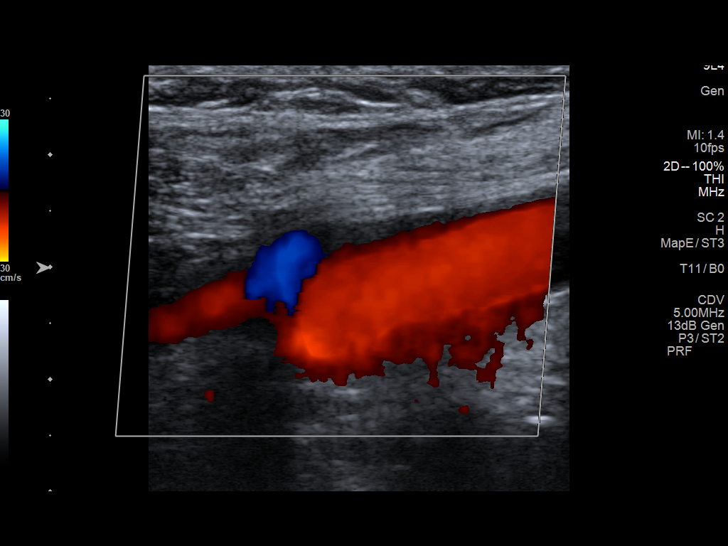
[im 57/69]
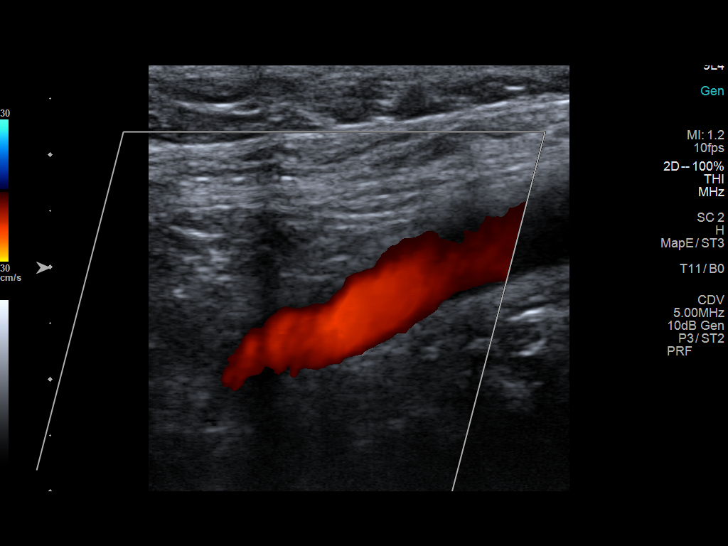
[im 63/69]
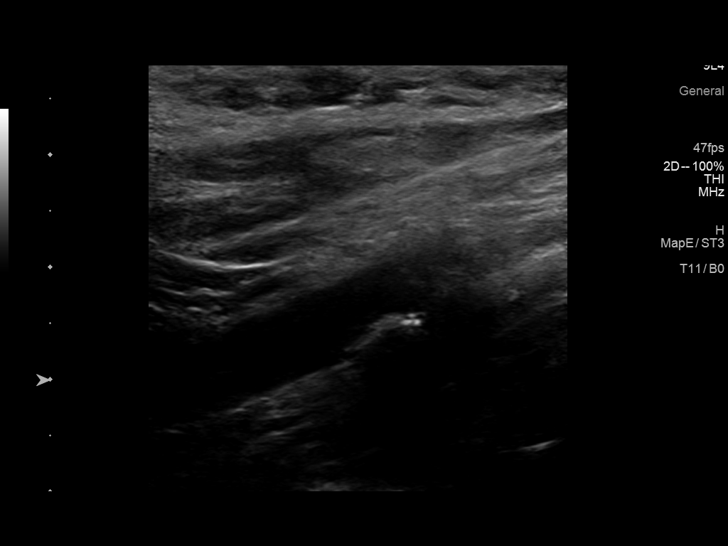
[im 69/69]
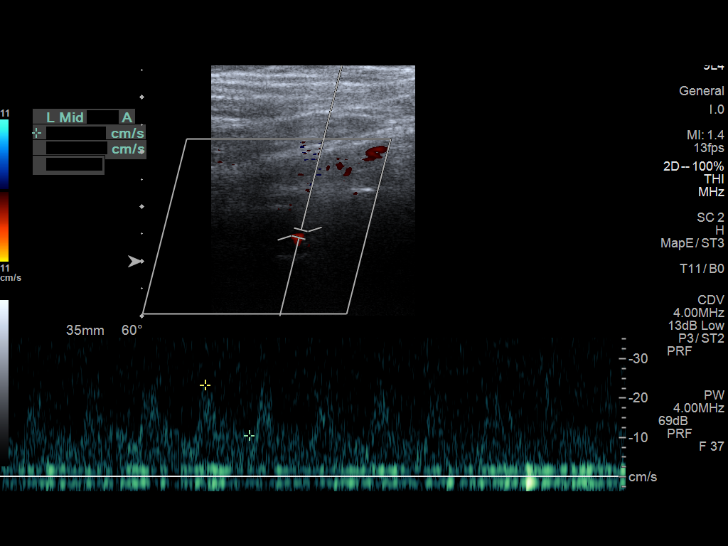

[13 of 24 positions shown; findings below may reference images not displayed]

FINDINGS: Criteria: Quantification of carotid stenosis is based on velocity
parameters that correlate the residual internal carotid diameter
with NASCET-based stenosis levels, using the diameter of the distal
internal carotid lumen as the denominator for stenosis measurement.

The following velocity measurements were obtained:

RIGHT

ICA: 83/23 cm/sec

CCA: 82/20 cm/sec

SYSTOLIC ICA/CCA RATIO:

ECA: 77 cm/sec

LEFT

ICA: 44/19 cm/sec

CCA: 66/13 cm/sec

SYSTOLIC ICA/CCA RATIO:

ECA: 96 cm/sec

RIGHT CAROTID ARTERY: There is a minimal amount of eccentric
echogenic plaque within the right carotid bulb (image 5 and 17), not
resulting in elevated peak systolic velocities within the
interrogated course of the right internal carotid artery to suggest
a hemodynamically significant stenosis.

RIGHT VERTEBRAL ARTERY:  Antegrade flow

LEFT CAROTID ARTERY: There is a minimal amount of eccentric
echogenic plaque involving the mid aspect the left common carotid
artery (image 45). There is a minimal amount of eccentric echogenic
plaque involving the origin and proximal aspects of the left
internal carotid artery (image 60), not resulting in elevated peak
systolic velocities within the interrogated course of the left
internal carotid artery to suggest a hemodynamically significant
stenosis.

LEFT VERTEBRAL ARTERY:  Antegrade flow

Upper extremity blood pressures: RIGHT: 128/77 LEFT: 135/84
IMPRESSION: Minimal amount of bilateral atherosclerotic plaque, left greater
than right, not resulting in a hemodynamically significant stenosis
within either internal carotid artery.

## 2022-06-30 ENCOUNTER — Ambulatory Visit
Admission: RE | Admit: 2022-06-30 | Discharge: 2022-06-30 | Disposition: A | Discharge status: Home / Self Care | Payer: Medicare Other | Source: Ambulatory Visit | Attending: Urology | Admitting: Urology

## 2022-06-30 DIAGNOSIS — D49512 Neoplasm of unspecified behavior of left kidney: Secondary | ICD-10-CM

## 2022-06-30 MED ORDER — GADOBENATE DIMEGLUMINE 529 MG/ML IV SOLN
15.0000 mL | Freq: Once | INTRAVENOUS | Status: AC | PRN
  Administered 2022-06-30: 15 mL via INTRAVENOUS

## 2022-07-06 ENCOUNTER — Other Ambulatory Visit (HOSPITAL_BASED_OUTPATIENT_CLINIC_OR_DEPARTMENT_OTHER): Payer: Self-pay

## 2022-07-06 MED ORDER — FLUAD QUADRIVALENT 0.5 ML IM PRSY
PREFILLED_SYRINGE | INTRAMUSCULAR | 0 refills | Status: DC
  Filled 2022-07-06: qty 0.5, 1d supply, fill #0

## 2022-07-28 ENCOUNTER — Encounter: Payer: Self-pay | Admitting: Pulmonary Disease

## 2022-07-28 ENCOUNTER — Ambulatory Visit (INDEPENDENT_AMBULATORY_CARE_PROVIDER_SITE_OTHER): Payer: Medicare Other | Admitting: Pulmonary Disease

## 2022-07-28 VITALS — BP 124/80 | HR 88 | Ht 69.0 in | Wt 216.4 lb

## 2022-07-28 DIAGNOSIS — G4734 Idiopathic sleep related nonobstructive alveolar hypoventilation: Secondary | ICD-10-CM | POA: Diagnosis not present

## 2022-07-28 DIAGNOSIS — J4489 Other specified chronic obstructive pulmonary disease: Secondary | ICD-10-CM | POA: Diagnosis not present

## 2022-07-28 NOTE — Progress Notes (Signed)
[facility]  Chief Complaint  Patient presents with   Follow-up    Pt states hes breathing has gotten slightly better.    Constitutional:  BP 124/80 (BP Location: Left Arm, Patient Position: Sitting, Cuff Size: Normal)   Pulse 88   Ht '5\' 9"'$  (1.753 m)   Wt 216 lb 6.4 oz (98.2 kg)   SpO2 92%   BMI 31.96 kg/m   Past Medical History:  HLD, HTN, Pre-DM  Past Surgical History:  He  has a past surgical history that includes Cholecystectomy and Tonsillectomy.  Brief Summary:  Steven Davies is a 81 y.o. male former smoker with COPD.       Subjective:   Breathing has been doing well.  Not having cough, wheeze, or sputum.  He had home sleep study with Dr. Toy Cookey while using his oral appliance.  AHI was 7, but he did have episodes of oxygen desaturation.  He feels oral appliance is working well.    Physical Exam:   Appearance - well kempt   ENMT - no sinus tenderness, no oral exudate, no LAN, Mallampati 3 airway, no stridor  Respiratory - equal breath sounds bilaterally, no wheezing or rales  CV - s1s2 regular rate and rhythm, no murmurs  Ext - no clubbing, no edema  Skin - no rashes  Psych - normal mood and affect     Pulmonary testing:  PFT 10/13/20 >> FEV1 1.90 (67%), FEV1% 56, TLC 5.87 (87%), DLCO 54%  Chest Imaging:  CT chest 07/26/18 >> LLL nodule was 14 mm in 2010 and now 2.5 mm, mild centrilobular emphysema CT chest 04/10/21 >> 2.8 x 2.0 x 2.5 cm Lt lung base hamartoma, fatty liver, atherosclerosis  Sleep Tests:  HST 05/06/21 >> AHI 13.4, SpO2 low 75%.  Spent 235.6 min with SpO2 < 89%.  Social History:  He  reports that he has quit smoking. His smoking use included cigarettes. He has a 88.00 pack-year smoking history. He has never used smokeless tobacco. He reports current alcohol use of about 3.0 standard drinks of alcohol per week. He reports that he does not use drugs.  Family History:  His family history  includes Heart attack in his father; Heart failure in his brother and mother.     Assessment/Plan:   COPD with asthma. - continue breo 200 one puff daily - prn albuterol  Diffusion defect on PFT. - this was out of proportion to other findings - will repeat PFT  Obstructive sleep apnea. - has oral appliance set up through Dr. Augustina Mood  Nocturnal hypoxemia. - will arrange for overnight oximetry with his using an oral appliance to assess further  Obesity. 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 If you have any questions, ask your nurse or doctor.          amLODipine 10 MG tablet Commonly known as: NORVASC Take  1 tablet (10 mg total) by mouth daily.   aspirin EC 81 MG tablet Take 81 mg by mouth daily. Swallow whole.   atorvastatin 10 MG tablet Commonly known as: LIPITOR TAKE 1 TABLET BY MOUTH EVERY DAY   finasteride 5 MG tablet Commonly known as: PROSCAR TAKE 1 TABLET BY MOUTH EVERY OTHER DAY   Fluad Quadrivalent 0.5 ML injection Generic drug: influenza vaccine adjuvanted Inject into the muscle.   fluticasone furoate-vilanterol 200-25 MCG/ACT Aepb Commonly known as: BREO ELLIPTA Inhale 1 puff into the lungs daily.   hydrochlorothiazide 12.5 MG capsule Commonly known as: MICROZIDE Take 1 capsule (12.5 mg total) by mouth every other day.   losartan 100 MG tablet Commonly known as: COZAAR TAKE 1 TABLET BY MOUTH EVERY DAY   metFORMIN 500 MG tablet Commonly known as: GLUCOPHAGE Take 1  tablet (500 mg total) by mouth 2 (two) times daily with a meal.   tamsulosin 0.4 MG Caps capsule Commonly known as: FLOMAX TAKE 1 CAPSULE BY MOUTH EVERY DAY        Signature:  Chesley Mires, MD [facility] Pager - [phone] 07/28/2022, 12:01 PM

## 2022-07-28 NOTE — Patient Instructions (Signed)
Will arrange for pulmonary function test  Will arrange for overnight oxygen test with you using your oral appliance  Follow up in 6 months

## 2022-08-16 ENCOUNTER — Telehealth: Payer: Self-pay | Admitting: Pulmonary Disease

## 2022-08-16 DIAGNOSIS — G4734 Idiopathic sleep related nonobstructive alveolar hypoventilation: Secondary | ICD-10-CM

## 2022-08-16 NOTE — Telephone Encounter (Signed)
ONO with oral appliance 08/11/22 >> test time 6 hrs 39 min.  Baseline SpO2 84%, low SpO2 74%.  Spent 6 hrs 35 min with an SpO2 < 88%.   Please confirm with the patient that he was using his oral appliance when he did the ONO.  If he was, then he needs an ROV with me to talk about what we need to do regarding is low oxygen levels at night.  Okay to schedule video visit for this.

## 2022-08-16 NOTE — Telephone Encounter (Signed)
ATC both numbers in patients chart and received "number is not in service message" Will mail a letter asking patient to call office for results.

## 2022-08-18 ENCOUNTER — Other Ambulatory Visit: Payer: Self-pay | Admitting: Internal Medicine

## 2022-08-24 NOTE — Telephone Encounter (Signed)
Called patient and he states that he was wearing his oral appliance the night of his ONO. Pt states current oral appliance has been in use since Feb. He wants to know if the oral appliance could be what is messing with his oxygen  Please advise sir

## 2022-08-24 NOTE — Telephone Encounter (Signed)
Patient would like to go over test results. Patient also requested to get a PFT and OV- scheduled for 09/21/22 at 11am PFT and follow up with Volanda Napoleon at 2pm  Please call and advise patient on test results. [phone]

## 2022-08-24 NOTE — Addendum Note (Signed)
Addended by: Fritzi Mandes D on: 08/24/2022 01:36 PM   Modules accepted: Orders

## 2022-08-24 NOTE — Telephone Encounter (Signed)
Called and spoke with patient and made him aware of response from Dr. Halford Chessman. He voiced understanding. Order placed for O2 set up and ONO.

## 2022-08-24 NOTE — Telephone Encounter (Signed)
Please let him know the oral appliance wouldn't cause his oxygen level to be low.  The concern is that his COPD is causing his oxygen to be low at night.  Please arrange for 2 liters oxygen at night.  He should continue using his oral appliance at night with 2 liters oxygen at night.  Please then arrange for a repeat overnight oximetry using his oral appliance and 2 liters supplemental oxygen.

## 2022-08-30 ENCOUNTER — Telehealth: Payer: Self-pay | Admitting: Pulmonary Disease

## 2022-08-30 NOTE — Telephone Encounter (Signed)
Called and spoke with pt and went over the results of the ONO with him again. Pt verbalized understanding. Nothing further needed.

## 2022-09-21 ENCOUNTER — Encounter: Payer: Self-pay | Admitting: Primary Care

## 2022-09-21 ENCOUNTER — Telehealth: Payer: Self-pay | Admitting: Primary Care

## 2022-09-21 ENCOUNTER — Ambulatory Visit (INDEPENDENT_AMBULATORY_CARE_PROVIDER_SITE_OTHER): Payer: Medicare Other | Admitting: Pulmonary Disease

## 2022-09-21 ENCOUNTER — Ambulatory Visit (INDEPENDENT_AMBULATORY_CARE_PROVIDER_SITE_OTHER): Payer: Medicare Other | Admitting: Primary Care

## 2022-09-21 VITALS — BP 112/68 | HR 91 | Temp 97.6°F | Ht 67.5 in | Wt 220.6 lb

## 2022-09-21 DIAGNOSIS — J449 Chronic obstructive pulmonary disease, unspecified: Secondary | ICD-10-CM

## 2022-09-21 DIAGNOSIS — G4734 Idiopathic sleep related nonobstructive alveolar hypoventilation: Secondary | ICD-10-CM | POA: Diagnosis not present

## 2022-09-21 DIAGNOSIS — G4733 Obstructive sleep apnea (adult) (pediatric): Secondary | ICD-10-CM | POA: Diagnosis not present

## 2022-09-21 DIAGNOSIS — J4489 Other specified chronic obstructive pulmonary disease: Secondary | ICD-10-CM | POA: Diagnosis not present

## 2022-09-21 LAB — PULMONARY FUNCTION TEST
DL/VA % pred: 72 %
DL/VA: 2.87 ml/min/mmHg/L
DLCO cor % pred: 64 %
DLCO cor: 14.56 ml/min/mmHg
DLCO unc % pred: 64 %
DLCO unc: 14.56 ml/min/mmHg
FEF 25-75 Post: 1.1 L/sec
FEF 25-75 Pre: 0.63 L/sec
FEF2575-%Change-Post: 74 %
FEF2575-%Pred-Post: 61 %
FEF2575-%Pred-Pre: 35 %
FEV1-%Change-Post: 15 %
FEV1-%Pred-Post: 70 %
FEV1-%Pred-Pre: 60 %
FEV1-Post: 1.83 L
FEV1-Pre: 1.58 L
FEV1FVC-%Change-Post: 6 %
FEV1FVC-%Pred-Pre: 83 %
FEV6-%Change-Post: 6 %
FEV6-%Pred-Post: 79 %
FEV6-%Pred-Pre: 75 %
FEV6-Post: 2.71 L
FEV6-Pre: 2.55 L
FEV6FVC-%Change-Post: -1 %
FEV6FVC-%Pred-Post: 102 %
FEV6FVC-%Pred-Pre: 104 %
FVC-%Change-Post: 9 %
FVC-%Pred-Post: 78 %
FVC-%Pred-Pre: 71 %
FVC-Post: 2.87 L
FVC-Pre: 2.63 L
Post FEV1/FVC ratio: 64 %
Post FEV6/FVC ratio: 95 %
Pre FEV1/FVC ratio: 60 %
Pre FEV6/FVC Ratio: 97 %
RV % pred: 111 %
RV: 2.78 L
TLC % pred: 86 %
TLC: 5.67 L

## 2022-09-21 NOTE — Progress Notes (Signed)
$'@Patient'H$  ID: Steven Davies, male    DOB: 12-05-1940, 81 y.o.   MRN: [id number]  Chief Complaint  Patient presents with   Follow-up    PFT  ONO 02 2L at night  Breathing is good     Referring provider: Virgie Dad, MD  HPI: 81 year old male, former smoker.  Past medical history significant for pretension, COPD, lung nodule, sleep apnea, type 2 diabetes, arthritis, hyperlipidemia.  Patient of Dr. Halford Chessman, last seen on 07/28/2022.   09/21/2022 Patient presents today for follow-up COPD/asthma with PFT.  Pulmonary function testing showed mild obstructive lung disease with positive bronchodilator response. He feels his breathing is well controlled on Breo Ellipta. He had an overnight oximetry test on 08/11/2022 that showed he spent 6 hours 35 minutes with SpO2 < 88%. He has been wearing 2L oxygen at night. He had repeat ONO on oxygen, results are not in chart- requesting from [facility]. He wears oral appliance at night for obstructive sleep apnea, he follows with Dr. Augustina Mood, DDS.     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 90 tablet 1   aspirin EC 81 MG tablet Take 81 mg by mouth daily. Swallow whole.     atorvastatin (LIPITOR) 10 MG tablet TAKE 1 TABLET BY MOUTH EVERY DAY 90 tablet 1   finasteride (PROSCAR) 5 MG tablet TAKE 1 TABLET BY MOUTH EVERY OTHER DAY 45 tablet 2   fluticasone furoate-vilanterol (BREO ELLIPTA) 200-25 MCG/ACT AEPB TAKE 1 PUFF BY MOUTH EVERY DAY 180 each 1   hydrochlorothiazide (MICROZIDE) 12.5 MG capsule Take 1 capsule (12.5 mg total) by mouth every other day. 30 capsule 3   influenza vaccine adjuvanted (FLUAD QUADRIVALENT) 0.5 ML injection Inject into the muscle. 0.5 mL 0   losartan (COZAAR) 100 MG tablet TAKE 1 TABLET BY MOUTH EVERY DAY 90 tablet 2   metFORMIN (GLUCOPHAGE) 500 MG tablet Take 1 tablet (500 mg total) by mouth 2 (two) times daily with a meal. 120 tablet 3   tamsulosin (FLOMAX) 0.4 MG CAPS capsule TAKE 1 CAPSULE BY MOUTH EVERY DAY 90 capsule 1   No facility-administered medications prior to visit.   Review of Systems  Review of Systems  Constitutional: Negative.   HENT: Negative.    Respiratory: Negative.      Physical Exam  BP 112/68 (BP Location: Left Arm, Patient Position: Sitting, Cuff Size: Large)   Pulse 91   Temp 97.6 F (36.4 C) (Temporal)   Ht 5' 7.5" (1.715 m)   Wt 220 lb 9.6 oz (100.1 kg)   SpO2 91%  BMI 34.04 kg/m  Physical Exam Constitutional:      Appearance: Normal appearance.  HENT:     Head: Normocephalic and atraumatic.  Cardiovascular:     Rate and Rhythm: Normal rate.  Pulmonary:     Effort: Pulmonary effort is normal.  Skin:    General: Skin is warm and dry.  Neurological:     General: No focal deficit present.     Mental Status: He is alert and oriented to person, place, and time. Mental status is at baseline.  Psychiatric:        Mood and Affect: Mood normal.        Behavior: Behavior normal.        Thought Content: Thought content normal.        Judgment: Judgment normal.      Lab  Results:  CBC    Component Value Date/Time   WBC 8.6 01/14/2022 0000   RBC 6.06 (A) 01/14/2022 0000   HGB 18.3 (A) 01/14/2022 0000   HCT 54 (A) 01/14/2022 0000   PLT 170 01/14/2022 0000    BMET    Component Value Date/Time   NA 145 05/20/2022 0000   K 3.9 05/20/2022 0000   CL 107 05/20/2022 0000   CO2 24 (A) 05/20/2022 0000   BUN 22 (A) 05/20/2022 0000   CREATININE 1.1 05/20/2022 0000   CALCIUM 9.3 05/20/2022 0000   GFRNONAA 68.72 02/03/2021 0300   GFRAA 79.65 02/03/2021 0300    BNP No results found for: "BNP"  ProBNP No results found for: "PROBNP"  Imaging: No results found.   Assessment & Plan:   COPD (chronic obstructive pulmonary disease) ([place]) - Stable; Symptoms are well controlled currently  - Pulmonary function testing with mild obstruction and positive BD response/ FEV1.83 L (70%) - Continue Breo 211mg one puff daily in the morning  Sleep apnea - Follows with orthodontics, wearing oral appliance nightly. Requesting repeat sleep study from Dr. FToy Cookeybe sent to review  Nocturnal hypoxia - ONO 08/11/22 on room air >> patient spent 6 hours 35 mins with SpO2 < 88%. Started on 2L oxygen at night, repeat ONO done (results have been requested). DME order to add humidification to oxygen.    EMartyn Ehrich NP 10/04/2022

## 2022-09-21 NOTE — Progress Notes (Signed)
PFT done today. 

## 2022-09-21 NOTE — Patient Instructions (Addendum)
Recommendations: - Continue Breo 200- one puff daily in the morning - Continue nocturnal oxygen - Continue oral appliance for sleep apnea   Orders: - Add humidification to home oxygen concentrator (please enter DME order) - Please call [employer] and have them send most recent ONO on oxygen form late November/early December to [facility] NP - Please call Dr. Augustina Mood DDS and have then send most resent sleep study on oral appliance to [facility] NP   Follow-up: - 3 months with Dr. Halford Davies (at [place] location)

## 2022-09-21 NOTE — Telephone Encounter (Signed)
Sleep study in June from Dr. Toy Davies  1 night- no appliance AHI 9 Min 02 69%  2 night band/turn 56m AHI 8 Min O2 71%  3 night band/turn 39mAHI 7 min O2 71%  4 night band/turn 64m64mHI 18 Min O2 74%  5 night band/turn 3mm30mI 8 Min O2 72%  6 night band/turn AHI 6 Min O2 78%  7 night band/turn AHI 7 Min O2 73%

## 2022-09-21 NOTE — Telephone Encounter (Signed)
Please call patient and let him know results of repeat overnight oximetry test.   ONO 08/11/22 on RA showed patient spent 6 hours 35 mins with SpO2 <88%. SpO2 low 47% and baseline 84%. Started on 2l oxygen.   Repeat ONO 09/09/22 on 2L showed patient spent 4 hours with Spo2 <88%. SpO2 low 79% and baseline 88%. Needs to increase oxygen to 4L.   Awaiting sleep study on oral appliance from Dr. Toy Cookey  We may need to reconsider CPAP after we get these results   CC: Dr. Halford Chessman

## 2022-09-22 NOTE — Telephone Encounter (Signed)
Called and spoke with pt letting him know the results of the repeated ONO and that he needs to increase his O2 up to 4L based off of the results. Pt verbalized understanding. Did place a new order to show that pt needs to wear 4L at night instead of the two liters.  Also let pt know the results from the sleep study that he had done at Dr. Corky Sing office.  Pt requested to have all reports mailed to him so I have made a copy of all reports and have placed them in the mail for pt.

## 2022-09-22 NOTE — Telephone Encounter (Signed)
Attempted to call pt to go over the ONO test results with him after he repeated the test but unable to reach. Left message for him to return call.

## 2022-10-04 DIAGNOSIS — G4734 Idiopathic sleep related nonobstructive alveolar hypoventilation: Secondary | ICD-10-CM | POA: Insufficient documentation

## 2022-10-04 NOTE — Assessment & Plan Note (Addendum)
-   ONO 08/11/22 on room air >> patient spent 6 hours 35 mins with SpO2 < 88%. Started on 2L oxygen at night, repeat ONO done (results have been requested). DME order to add humidification to oxygen.

## 2022-10-04 NOTE — Assessment & Plan Note (Addendum)
-   Follows with orthodontics, wearing oral appliance nightly. Requesting repeat sleep study from Dr. Toy Cookey be sent to review

## 2022-10-04 NOTE — Assessment & Plan Note (Signed)
-   Stable; Symptoms are well controlled currently  - Pulmonary function testing with mild obstruction and positive BD response/ FEV1.83 L (70%) - Continue Breo 240mg one puff daily in the morning

## 2022-10-05 NOTE — Progress Notes (Signed)
Reviewed and agree with assessment/plan.   Chesley Mires, MD [facility] Pulmonary/Critical Care 10/05/2022, 8:06 AM Pager:  [phone]

## 2022-10-07 ENCOUNTER — Other Ambulatory Visit: Payer: Self-pay | Admitting: Internal Medicine

## 2022-10-07 NOTE — Telephone Encounter (Signed)
Patient has request refill on medication Tamsulosin 0.'4mg'$ . Patient medication has Allergy Contraindications. Medication pend and sent to PCP Virgie Dad, MD for approval.

## 2022-10-11 ENCOUNTER — Encounter: Payer: 59 | Admitting: Adult Health

## 2022-10-12 ENCOUNTER — Telehealth: Payer: Self-pay

## 2022-10-12 LAB — CBC AND DIFFERENTIAL
HCT: 52 (ref 41–53)
Hemoglobin: 17.5 (ref 13.5–17.5)
Platelets: 149 10*3/uL — AB (ref 150–400)
WBC: 7.5

## 2022-10-12 LAB — COMPREHENSIVE METABOLIC PANEL
Albumin: 4.1 (ref 3.5–5.0)
Calcium: 9.6 (ref 8.7–10.7)
Globulin: 2.7
eGFR: 57

## 2022-10-12 LAB — HEPATIC FUNCTION PANEL
ALT: 31 U/L (ref 10–40)
AST: 29 (ref 14–40)
Alkaline Phosphatase: 110 (ref 25–125)
Bilirubin, Total: 0.9

## 2022-10-12 LAB — BASIC METABOLIC PANEL
BUN: 18 (ref 4–21)
CO2: 26 — AB (ref 13–22)
Chloride: 108 (ref 99–108)
Creatinine: 1.3 (ref 0.6–1.3)
Glucose: 146
Potassium: 4.1 mEq/L (ref 3.5–5.1)
Sodium: 146 (ref 137–147)

## 2022-10-12 LAB — CBC: RBC: 5.77 — AB (ref 3.87–5.11)

## 2022-10-12 LAB — HEMOGLOBIN A1C: Hemoglobin A1C: 7.1

## 2022-10-12 NOTE — Telephone Encounter (Signed)
Patient came to get his labs done and noticed he did not have PSA and just needed it added on the labs

## 2022-10-18 ENCOUNTER — Other Ambulatory Visit: Payer: Self-pay | Admitting: Internal Medicine

## 2022-10-19 ENCOUNTER — Non-Acute Institutional Stay: Payer: Medicare Other | Admitting: Internal Medicine

## 2022-10-19 ENCOUNTER — Encounter: Payer: Self-pay | Admitting: Internal Medicine

## 2022-10-19 VITALS — BP 144/90 | HR 84 | Temp 98.1°F | Resp 18 | Ht 68.0 in | Wt 216.0 lb

## 2022-10-19 DIAGNOSIS — M545 Low back pain, unspecified: Secondary | ICD-10-CM

## 2022-10-19 DIAGNOSIS — D751 Secondary polycythemia: Secondary | ICD-10-CM

## 2022-10-19 DIAGNOSIS — G4733 Obstructive sleep apnea (adult) (pediatric): Secondary | ICD-10-CM | POA: Diagnosis not present

## 2022-10-19 DIAGNOSIS — I10 Essential (primary) hypertension: Secondary | ICD-10-CM

## 2022-10-19 DIAGNOSIS — J449 Chronic obstructive pulmonary disease, unspecified: Secondary | ICD-10-CM | POA: Diagnosis not present

## 2022-10-19 DIAGNOSIS — E1165 Type 2 diabetes mellitus with hyperglycemia: Secondary | ICD-10-CM | POA: Diagnosis not present

## 2022-10-19 MED ORDER — MELOXICAM 15 MG PO TABS: 15.0000 mg | ORAL_TABLET | Freq: Every day | ORAL | 0 refills | Status: DC

## 2022-10-19 NOTE — Progress Notes (Signed)
Abstraction  

## 2022-10-19 NOTE — Progress Notes (Signed)
Location:  [place] of Service:  Clinic (12)  Provider:   Code Status:  Goals of Care:     10/19/2022   11:28 AM  Advanced Directives  Does Patient Have a Medical Advance Directive? Yes  Type of Advance Directive Living will;Healthcare Power of [place];Out of facility DNR (pink MOST or yellow form)  Does patient want to make changes to medical advance directive? No - Patient declined  Copy of [place] in Chart? No - copy requested     Chief Complaint  Patient presents with   Medical Management of Chronic Issues    4 month follow up and lower back pain.   Quality Metric Gaps    Discussed need for AWV , Diabetic kidney and hep c screening   Immunizations    Discussed the need for tdap and shingles    HPI: Patient is a 82 y.o. male seen today for medical management of chronic diseases.    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  Stable on Breo Does get SOB when climb the hill or Hike  Sleep Apnea Has Dental insert Also on Continuous Oxygen at night 4 litre's Having Nasal Congestion due to Oxygen Nose Bleed Follow up with Dr Halford Chessman  Overweight with Diabetes  Stopped Metformin during holidays due to Diarrhea But has restarted it again Says his tools are loose and wants to know if he should stop it again Back pain New issue Lower back. Not radiating down to legs but going down to his Hips Pain Is Midline both sides Took some Advil which has helped No Weakness or Tingling in legs No Falls Worse with standing Walking helps  Other issues History of complicated renal cyst. 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 Swallow whole.   atorvastatin (LIPITOR) 10 MG tablet TAKE 1 TABLET BY MOUTH EVERY DAY   finasteride (PROSCAR) 5 MG tablet TAKE 1 TABLET BY MOUTH EVERY OTHER DAY   fluticasone furoate-vilanterol (BREO ELLIPTA) 200-25 MCG/ACT AEPB TAKE 1 PUFF BY MOUTH EVERY DAY   hydrochlorothiazide (MICROZIDE) 12.5 MG capsule Take 1 capsule (12.5 mg total) by mouth every other day.   influenza vaccine adjuvanted (FLUAD QUADRIVALENT) 0.5 ML injection Inject into the muscle.   losartan (COZAAR) 100 MG tablet TAKE 1 TABLET BY MOUTH EVERY DAY   metFORMIN (GLUCOPHAGE) 500 MG tablet Take 1 tablet (500 mg total) by mouth 2 (two) times daily with a meal.   OXYGEN  Inhale into the lungs.   tamsulosin (FLOMAX) 0.4 MG CAPS capsule TAKE 1 CAPSULE BY MOUTH EVERY DAY   No facility-administered encounter medications on file as of 10/19/2022.    Review of Systems:  Review of Systems  Constitutional:  Negative for activity change, appetite change and unexpected weight change.  HENT: Negative.    Respiratory:  Negative for cough and shortness of breath.   Cardiovascular:  Negative for leg swelling.  Gastrointestinal:  Positive for diarrhea. Negative for constipation.  Genitourinary:  Negative for frequency.  Musculoskeletal:  Positive for back pain. Negative for arthralgias, gait problem and myalgias.  Skin: Negative.  Negative for  rash.  Neurological:  Negative for dizziness and weakness.  Psychiatric/Behavioral:  Negative for confusion and sleep disturbance.   All other systems reviewed and are negative.   Health Maintenance  Topic Date Due   Medicare Annual Wellness (AWV)  Never done   Diabetic kidney evaluation - Urine ACR  Never done   Hepatitis C Screening  Never done   DTaP/Tdap/Td (1 - Tdap) Never done   Zoster Vaccines- Shingrix (1 of 2) Never done   COVID-19 Vaccine (7 - 2023-24 season) 11/04/2022 (Originally 09/29/2022)   OPHTHALMOLOGY EXAM  12/28/2022   FOOT EXAM  01/28/2023   HEMOGLOBIN A1C  04/12/2023   Diabetic kidney evaluation - eGFR measurement  10/13/2023   Pneumonia Vaccine 2+ Years old  Completed   INFLUENZA VACCINE  Completed   HPV VACCINES  Aged Out    Physical Exam: Vitals:   10/19/22 1128  BP: (!) 144/90  Pulse: 84  Resp: 18  Temp: 98.1 F (36.7 C)  TempSrc: Temporal  SpO2: 94%  Weight: 216 lb (98 kg)  Height: '5\' 8"'$  (1.727 m)   Body mass index is 32.84 kg/m. Physical Exam Vitals reviewed.  Constitutional:      Appearance: Normal appearance.  HENT:     Head: Normocephalic.     Nose: Nose normal.     Mouth/Throat:     Mouth: Mucous membranes are moist.     Pharynx: Oropharynx is clear.  Eyes:     Pupils: Pupils are equal, round, and reactive to light.  Cardiovascular:     Rate and Rhythm: Normal rate and regular rhythm.     Pulses: Normal pulses.     Heart sounds: No murmur heard. Pulmonary:     Effort: Pulmonary effort is normal. No respiratory distress.     Breath sounds: Normal breath sounds. No rales.  Abdominal:     General: Abdomen is flat. Bowel sounds are normal.     Palpations: Abdomen is soft.  Musculoskeletal:        General: No swelling.     Cervical back: Neck supple.     Comments: Gait stable Straight Leg raising Normal Bilateral Hip Exam was normal  Skin:    General: Skin is warm.  Neurological:     General: No focal deficit present.      Mental Status: He is alert and oriented to person, place, and time.  Psychiatric:        Mood and Affect: Mood normal.        Thought Content: Thought content normal.     Labs reviewed: Basic Metabolic Panel: Recent Labs    01/14/22 0000 05/20/22 0000 10/12/22 0000  NA 145 145 146  K 4.0 3.9 4.1  CL 107 107 108  CO2 24* 24* 26*  BUN 27* 22* 18  CREATININE 1.0 1.1 1.3  CALCIUM 9.7  9.3 9.6  TSH 2.43  --   --    Liver Function Tests: Recent Labs    01/14/22 0000 10/12/22 0000  AST 28 29  ALT 35 31  ALKPHOS 111 110  ALBUMIN 4.3 4.1   No results for input(s): "LIPASE", "AMYLASE" in the last 8760 hours. No results for input(s): "AMMONIA" in the last 8760 hours. CBC: Recent Labs    01/14/22 0000 10/12/22 0000  WBC 8.6 7.5  HGB 18.3* 17.5  HCT 54* 52  PLT 170 149*   Lipid Panel: No results for input(s): "CHOL", "HDL", "LDLCALC", "TRIG", "CHOLHDL", "LDLDIRECT" in the last 8760 hours. Lab Results  Component Value Date   HGBA1C 7.1 10/12/2022    Procedures since last visit: No results found.  Assessment/Plan 1. Type 2 diabetes mellitus with hyperglycemia, without long-term current use of insulin (HCC) Change Metformin to 500 mg QD for now from BID due to him having Loose stools Continue Diet and Exercise Repeat A1C in 3 months Has Appointment with Opthalmologist 2. Chronic obstructive pulmonary disease, unspecified COPD type ([place]) Breo  3. Obstructive sleep apnea syndrome Now n Continues Oxygen 4 lit  4. Erythrocytosis Improved since he has been on oxygen Will Follow  5. Primary hypertension Norvasc.Cozaar and HCTZ  6. 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

## 2022-10-20 ENCOUNTER — Other Ambulatory Visit (HOSPITAL_BASED_OUTPATIENT_CLINIC_OR_DEPARTMENT_OTHER): Payer: Self-pay

## 2022-10-20 MED ORDER — PREVNAR 20 0.5 ML IM SUSY
PREFILLED_SYRINGE | INTRAMUSCULAR | 0 refills | Status: DC
  Filled 2022-10-20: qty 0.5, 1d supply, fill #0

## 2022-10-25 ENCOUNTER — Other Ambulatory Visit: Payer: Self-pay | Admitting: Internal Medicine

## 2022-11-08 ENCOUNTER — Other Ambulatory Visit (HOSPITAL_BASED_OUTPATIENT_CLINIC_OR_DEPARTMENT_OTHER): Payer: Self-pay

## 2022-11-08 MED ORDER — SHINGRIX 50 MCG/0.5ML IM SUSR
INTRAMUSCULAR | 1 refills | Status: DC
  Filled 2022-11-08: qty 0.5, 1d supply, fill #0

## 2022-11-10 ENCOUNTER — Other Ambulatory Visit (HOSPITAL_BASED_OUTPATIENT_CLINIC_OR_DEPARTMENT_OTHER): Payer: Self-pay

## 2022-12-09 ENCOUNTER — Ambulatory Visit (HOSPITAL_BASED_OUTPATIENT_CLINIC_OR_DEPARTMENT_OTHER): Payer: Medicare Other | Admitting: Pulmonary Disease

## 2022-12-09 ENCOUNTER — Encounter (HOSPITAL_BASED_OUTPATIENT_CLINIC_OR_DEPARTMENT_OTHER): Payer: Self-pay | Admitting: Pulmonary Disease

## 2022-12-09 VITALS — BP 118/68 | HR 93 | Temp 98.3°F | Ht 68.0 in | Wt 215.2 lb

## 2022-12-09 DIAGNOSIS — J449 Chronic obstructive pulmonary disease, unspecified: Secondary | ICD-10-CM

## 2022-12-09 DIAGNOSIS — G4733 Obstructive sleep apnea (adult) (pediatric): Secondary | ICD-10-CM

## 2022-12-09 DIAGNOSIS — G4734 Idiopathic sleep related nonobstructive alveolar hypoventilation: Secondary | ICD-10-CM | POA: Diagnosis not present

## 2022-12-09 DIAGNOSIS — D751 Secondary polycythemia: Secondary | ICD-10-CM | POA: Diagnosis not present

## 2022-12-09 NOTE — Patient Instructions (Signed)
Will arrange for overnight oxygen test with 4 liters oxygen and oral appliance  Follow up in 4 months

## 2022-12-09 NOTE — Progress Notes (Signed)
[facility], Critical Care, and Sleep Medicine  Chief Complaint  Patient presents with   Follow-up    Pt is wanting to know if he still needs to wear 4L O2 at night as he has not had a recent ONO since recent testing performed by Dr. Toy Cookey when sleep study was done.    Constitutional:  BP 118/68 (BP Location: Left Arm, Patient Position: Sitting, Cuff Size: Large)   Pulse 93   Temp 98.3 F (36.8 C) (Oral)   Ht '5\' 8"'$  (1.727 m)   Wt 215 lb 3.2 oz (97.6 kg)   SpO2 94% Comment: RA  BMI 32.72 kg/m   Past Medical History:  HLD, HTN, Pre-DM  Past Surgical History:  He  has a past surgical history that includes Cholecystectomy and Tonsillectomy.  Brief Summary:  Steven Davies is a 82 y.o. male former smoker with COPD and obstructive sleep apnea.      Subjective:   He is here with his wife.  He has been using oral appliance.  Overnight oximetry showed his oxygen level was still low.  Started on 2 and then increased to 4 liters oxygen at night.  He is concerned about how to travel with oxygen.  He also wants to know if he will need oxygen long term.  He is not having cough, wheeze, or sputum.  Physical Exam:   Appearance - well kempt   ENMT - no sinus tenderness, no oral exudate, no LAN, Mallampati 3 airway, no stridor  Respiratory - equal breath sounds bilaterally, no wheezing or rales  CV - s1s2 regular rate and rhythm, no murmurs  Ext - no clubbing, no edema  Skin - no rashes  Psych - normal mood and affect      Pulmonary testing:  PFT 10/13/20 >> FEV1 1.90 (67%), FEV1% 56, TLC 5.87 (87%), DLCO 54% PFT 09/21/22 >> FEV1 1.83 (70%), FEV1% 64, TLC 5.67 (86%), DLCO 64%  Chest Imaging:  CT chest 07/26/18 >> LLL nodule was 14 mm in 2010 and now 2.5 mm, mild centrilobular emphysema CT chest 04/10/21 >> 2.8 x 2.0 x 2.5 cm Lt lung base hamartoma, fatty liver, atherosclerosis  Sleep Tests:  HST 05/06/21 >> AHI 13.4, SpO2 low 75%.  Spent 235.6 min with SpO2 <  89%. ONO with oral appliance 08/11/22 >> test time 6 hrs 39 min. Baseline SpO2 84%, low SpO2 74%. Spent 6 hrs 35 min with an SpO2 < 88%   Social History:  He  reports that he has quit smoking. His smoking use included cigarettes. He has a 88.00 pack-year smoking history. He has never used smokeless tobacco. He reports current alcohol use of about 3.0 standard drinks of alcohol per week. He reports that he does not use drugs.  Family History:  His family history includes Heart attack in his father; Heart failure in his brother and mother.     Assessment/Plan:   COPD with asthma and emphysema. - continue breo 200 one puff daily - prn albuterol  Obstructive sleep apnea. - oral appliance from Dr. Augustina Mood - if additional sleep testing is needed he would need to have this done in the sleep lab due to his supplemental oxygen needs - he might need to revisit PAP therapy if his oxygen levels are still running low  Nocturnal hypoxemia. - will arrange for repeat overnight oximetry with 4 liters oxygen and oral appliance  Obesity. - discussed importance of weight loss  Lt lower lung nodule. - consistent with  hamartoma  Erythrocytosis. - Hb has been between 17.5 and 18.3 over the past 1 year - likely from hypoxemia  Time Spent Involved in Patient Care on Day of Examination:  38 minutes  Follow up:   Patient Instructions  Will arrange for overnight oxygen test with 4 liters oxygen and oral appliance  Follow up in 4 months  Medication List:   Allergies as of 12/09/2022       Reactions   Sulfa Antibiotics         Medication List        Accurate as of December 09, 2022  4:47 PM. If you have any questions, ask your nurse or doctor.          STOP taking these medications    Fluad Quadrivalent 0.5 ML injection Generic drug: influenza vaccine adjuvanted Stopped by: Chesley Mires, MD   meloxicam 15 MG tablet Commonly known as: MOBIC Stopped by: Chesley Mires, MD    Prevnar 20 0.5 ML injection Generic drug: pneumococcal 20-valent conjugate vaccine Stopped by: Chesley Mires, MD   Shingrix injection Generic drug: Zoster Vaccine Adjuvanted Stopped by: Chesley Mires, MD       TAKE these medications    amLODipine 10 MG tablet Commonly known as: NORVASC TAKE 1 TABLET BY MOUTH EVERY DAY   aspirin EC 81 MG tablet Take 81 mg by mouth daily. Swallow whole.   atorvastatin 10 MG tablet Commonly known as: LIPITOR TAKE 1 TABLET BY MOUTH EVERY DAY   Breo Ellipta 200-25 MCG/ACT Aepb Generic drug: fluticasone furoate-vilanterol TAKE 1 PUFF BY MOUTH EVERY DAY   finasteride 5 MG tablet Commonly known as: PROSCAR TAKE 1 TABLET BY MOUTH EVERY OTHER DAY   hydrochlorothiazide 12.5 MG capsule Commonly known as: MICROZIDE Take 1 capsule (12.5 mg total) by mouth every other day.   losartan 100 MG tablet Commonly known as: COZAAR TAKE 1 TABLET BY MOUTH EVERY DAY   metFORMIN 500 MG tablet Commonly known as: GLUCOPHAGE Take 500 mg by mouth daily.   OXYGEN Inhale into the lungs.   predniSONE 5 MG (21) Tbpk tablet Commonly known as: STERAPRED UNI-PAK 21 TAB TAKE 6 TABLETS ON DAY 1 AS DIRECTED ON PACKAGE AND DECREASE BY 1 TAB EACH DAY FOR A TOTAL OF 6 DAYS   tamsulosin 0.4 MG Caps capsule Commonly known as: FLOMAX TAKE 1 CAPSULE BY MOUTH EVERY DAY        Signature:  Chesley Mires, MD [place] Pager - [phone] 12/09/2022, 4:47 PM

## 2022-12-22 ENCOUNTER — Telehealth: Payer: Self-pay | Admitting: Pulmonary Disease

## 2022-12-22 DIAGNOSIS — G4734 Idiopathic sleep related nonobstructive alveolar hypoventilation: Secondary | ICD-10-CM

## 2022-12-22 NOTE — Telephone Encounter (Signed)
ONO with 4 liters and oral appliance, 12/14/22 >> test time 7 hrs 59 min.  Baseline SpO2 90%, low SpO2 82%.  Spent 49 min 30 sec with an SpO2 < 88%.  Results d/w pt and his wife.  Will try increasing nocturnal oxygen to 5 liters and repeat ONO with 5 liters and oral appliance.  If he still has nocturnal oxygen desaturation, then would need to arrange for in-lab sleep study to determine if PAP therapy +/- oxygen would be better option for him.

## 2023-01-05 ENCOUNTER — Encounter: Payer: Self-pay | Admitting: Pulmonary Disease

## 2023-01-11 ENCOUNTER — Non-Acute Institutional Stay: Payer: Medicare Other | Admitting: Internal Medicine

## 2023-01-11 ENCOUNTER — Encounter: Payer: Self-pay | Admitting: Internal Medicine

## 2023-01-11 VITALS — BP 124/78 | HR 109 | Temp 98.0°F | Resp 17 | Ht 68.0 in | Wt 218.4 lb

## 2023-01-11 DIAGNOSIS — E1165 Type 2 diabetes mellitus with hyperglycemia: Secondary | ICD-10-CM | POA: Diagnosis not present

## 2023-01-11 DIAGNOSIS — R300 Dysuria: Secondary | ICD-10-CM | POA: Insufficient documentation

## 2023-01-11 DIAGNOSIS — R600 Localized edema: Secondary | ICD-10-CM | POA: Diagnosis not present

## 2023-01-11 DIAGNOSIS — G4733 Obstructive sleep apnea (adult) (pediatric): Secondary | ICD-10-CM

## 2023-01-11 DIAGNOSIS — I10 Essential (primary) hypertension: Secondary | ICD-10-CM | POA: Diagnosis not present

## 2023-01-11 NOTE — Progress Notes (Signed)
Location: [facility]   Place of Service:  Clinic (12)  Provider:   Code Status:  Goals of Care:     01/11/2023    1:03 PM  Advanced Directives  Does Patient Have a Medical Advance Directive? Yes  Type of Estate agent[place]vance Directive Healthcare [place];Out of facility DNR (pink MOST or yellow form)  Copy of Healthcare Power of Attorney in Chart? No - copy requested     Chief Complaint  Patient presents with   Acute Visit    Patient states he will like to discuss getting some referral and discuss some previous concerns   Health Maintenance    Patient is due for 2nd shingles vaccine, hep c screening and diabetic kidney evaluation    HPI: Patient is a 82 y.o. 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 He has no Chest pain or Palpitations Some swelling in his Legs No Chest pain Does have SOB with Exertion  Back pain Seen by Dr Cecile HearingBeanes office was started on Prednisone Which helped the pain but it is back again He is taking Advil 600 mg QD Wants referral to Neurology  Other issues  History of complicated renal cyst. 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 Swallow whole.   atorvastatin (LIPITOR) 10 MG tablet TAKE 1 TABLET BY MOUTH EVERY DAY   finasteride (PROSCAR) 5 MG tablet TAKE 1 TABLET BY MOUTH EVERY OTHER DAY   fluticasone furoate-vilanterol (BREO ELLIPTA) 200-25 MCG/ACT AEPB TAKE 1 PUFF BY MOUTH EVERY DAY   hydrochlorothiazide (MICROZIDE) 12.5 MG capsule Take 1 capsule (12.5 mg total) by mouth every other day.   losartan (COZAAR) 100 MG tablet TAKE 1 TABLET BY MOUTH EVERY DAY   metFORMIN (GLUCOPHAGE) 500 MG tablet Take 500 mg by mouth daily.   OXYGEN Inhale into the lungs.   tamsulosin (FLOMAX) 0.4 MG CAPS capsule TAKE 1 CAPSULE BY MOUTH EVERY DAY   predniSONE (STERAPRED UNI-PAK 21 TAB) 5 MG (21) TBPK tablet TAKE 6 TABLETS ON DAY 1 AS DIRECTED ON PACKAGE AND DECREASE BY 1 TAB EACH DAY FOR A TOTAL OF 6 DAYS (Patient not taking: Reported on 01/11/2023)   No facility-administered encounter medications on file as of 01/11/2023.    Review of Systems:  Review of Systems  Constitutional:  Positive for activity  change. Negative for appetite change and unexpected weight change.  HENT: Negative.    Respiratory:  Positive for shortness of breath. Negative for cough.   Cardiovascular:  Positive for leg swelling.  Gastrointestinal:  Negative for constipation.  Genitourinary:  Positive for frequency.  Musculoskeletal:  Positive for back pain. Negative for arthralgias, gait problem and myalgias.  Skin: Negative.  Negative for rash.  Neurological:  Negative for dizziness and weakness.  Psychiatric/Behavioral:  Negative for confusion and sleep disturbance.   All other systems reviewed and are negative.   Health Maintenance  Topic Date Due   Diabetic kidney evaluation - Urine ACR  Never done   Hepatitis  C Screening  Never done   Zoster Vaccines- Shingrix (2 of 2) 01/03/2023   OPHTHALMOLOGY EXAM  01/11/2023 (Originally 12/28/2022)   COVID-19 Vaccine (7 - 2023-24 season) 01/27/2023 (Originally 09/29/2022)   Medicare Annual Wellness (AWV)  02/10/2023 (Originally [birth year])   FOOT EXAM  01/28/2023   HEMOGLOBIN A1C  04/12/2023   INFLUENZA VACCINE  05/05/2023   Diabetic kidney evaluation - eGFR measurement  10/13/2023   Pneumonia Vaccine 25+ Years old  Completed   HPV VACCINES  Aged Out   DTaP/Tdap/Td  Discontinued    Physical Exam: Vitals:   01/11/23 1305  BP: 124/78  Pulse: (!) 109  Resp: 17  Temp: 98 F (36.7 C)  TempSrc: Temporal  SpO2: 98%  Weight: 218 lb 6.4 oz (99.1 kg)  Height: 5\' 8"  (1.727 m)   Body mass index is 33.21 kg/m. Physical Exam Vitals reviewed.  Constitutional:      Appearance: Normal appearance. He is obese.  HENT:     Head: Normocephalic.     Nose: Nose normal.     Mouth/Throat:     Mouth: Mucous membranes are moist.     Pharynx: Oropharynx is clear.  Eyes:     Pupils: Pupils are equal, round, and reactive to light.  Cardiovascular:     Rate and Rhythm: Normal rate and regular rhythm.     Pulses: Normal pulses.     Heart sounds: No murmur heard. Pulmonary:     Effort: Pulmonary effort is normal. No respiratory distress.     Breath sounds: Normal breath sounds. No rales.  Abdominal:     General: Abdomen is flat. Bowel sounds are normal.     Palpations: Abdomen is soft.  Musculoskeletal:        General: Swelling present.     Cervical back: Neck supple.  Skin:    General: Skin is warm.  Neurological:     General: No focal deficit present.     Mental Status: He is alert and oriented to person, place, and time.  Psychiatric:        Mood and Affect: Mood normal.        Thought Content: Thought content normal.    Labs reviewed: Basic Metabolic Panel: Recent Labs    01/14/22 0000 05/20/22 0000 10/12/22 0000  NA 145 145 146  K 4.0 3.9  4.1  CL 107 107 108  CO2 24* 24* 26*  BUN 27* 22* 18  CREATININE 1.0 1.1 1.3  CALCIUM 9.7 9.3 9.6  TSH 2.43  --   --    Liver Function Tests: Recent Labs    01/14/22 0000 10/12/22 0000  AST 28 29  ALT 35 31  ALKPHOS 111 110  ALBUMIN 4.3 4.1   No results for input(s): "LIPASE", "AMYLASE" in the last 8760 hours. No results for input(s): "AMMONIA"  in the last 8760 hours. CBC: Recent Labs    01/14/22 0000 10/12/22 0000  WBC 8.6 7.5  HGB 18.3* 17.5  HCT 54* 52  PLT 170 149*   Lipid Panel: No results for input(s): "CHOL", "HDL", "LDLCALC", "TRIG", "CHOLHDL", "LDLDIRECT" in the last 8760 hours. Lab Results  Component Value Date   HGBA1C 7.1 10/12/2022    Procedures since last visit: No results found.  Assessment/Plan 1. Primary hypertension On Norvasc,HCTZ Cozaar   2. Obstructive sleep apnea syndrome Now needing 5 l of Oxygen  Has appointment with Pulmonary Dr Craige Cotta for further Plan   - Ambulatory referral to Cardiology  3. Bilateral leg edema For Possible Echo to evaluate SOB and Pulmonary Hypertension - Ambulatory referral to Cardiology  4. Type 2 diabetes mellitus with hyperglycemia, without long-term current use of insulin Change Metformin to 500 mg QD for now from BID due to him having Loose stools Continue Diet and Exercise A1C pending 5. Erythrocytosis Improved since he has been on oxygen Will Follow 6 Low Back Pain He wanted referral to Neurology We talked how it would be better if he goes back to Dr Shelle Iron office for possible MRI of Back and Injection He is taking Advil almost every day We talked about Side effects and Possible Pepcid OTC for GI protection    Labs/tests ordered:   Next appt:  02/01/2023

## 2023-01-17 ENCOUNTER — Telehealth: Payer: Self-pay | Admitting: Pulmonary Disease

## 2023-01-17 NOTE — Telephone Encounter (Signed)
The overnight oximetry scanned into epic is from 12/14/22.  This was done with him using 4 liters oxygen and showed his oxygen was still low at night.  Based off of this he was to have his oxygen increased to 5 liters at night, and then repeat an overnight oximetry on 5 liters with his oral appliance in place.  I have not received results for this.  Will need to see this first before determining if he needs to consider alternative therapies for sleep apnea.

## 2023-01-17 NOTE — Telephone Encounter (Signed)
I have not received his most recent overnight oximetry.  The POC will depend on which DME he uses and what they can supply.  If he is going to purchase a POC on his own, then [employer] is a good product.

## 2023-01-17 NOTE — Telephone Encounter (Signed)
Called and spoke with patient. Patient stated that he wants to know if Dr. Craige Cotta can go and look at his overnight oximetry results because it is now uploaded in [facility]. Patient also stated that he wants to discuss oral appliance, cpap, and all his options to see if cpap is what he really wants to do.   VS, please advise.

## 2023-01-17 NOTE — Telephone Encounter (Signed)
Pt called the office stating that he had an ONO about a week ago. States that he has not heard about the results and wants to know if Dr. Craige Cotta has been able to review these results.  Pt also wants info or recommendations from Dr. Craige Cotta about which type of POC would be best for him.  Dr. Craige Cotta, please advise on this for pt.

## 2023-01-17 NOTE — Telephone Encounter (Signed)
Called and spoke with patient. Patient verbalized understanding. Nothing further needed.  

## 2023-01-24 ENCOUNTER — Telehealth (HOSPITAL_BASED_OUTPATIENT_CLINIC_OR_DEPARTMENT_OTHER): Payer: Self-pay | Admitting: Pulmonary Disease

## 2023-01-24 NOTE — Telephone Encounter (Signed)
Pt. Calling to get Over night OX test to go over results

## 2023-01-25 NOTE — Telephone Encounter (Signed)
Spk with patient states the results he was given in mychart are for the incorrect date. He's looking for ONO from 12/30/22. Called [employer] who said results were requested 12/21/22 line disconnected after I provided [facility] fax number

## 2023-01-25 NOTE — Telephone Encounter (Signed)
Per Dr. Trula Slade, MD      01/17/23  4:47 PM Note The overnight oximetry scanned into epic is from 12/14/22.  This was done with him using 4 liters oxygen and showed his oxygen was still low at night.  Based off of this he was to have his oxygen increased to 5 liters at night, and then repeat an overnight oximetry on 5 liters with his oral appliance in place.  I have not received results for this.  Will need to see this first before determining if he needs to consider alternative therapies for sleep apnea.

## 2023-01-26 ENCOUNTER — Telehealth: Payer: Self-pay | Admitting: Pulmonary Disease

## 2023-01-26 NOTE — Telephone Encounter (Signed)
ONO with 5 liters and oral appliance 12/29/22 >> test time 9 hrs 40 sec.  Baseline SpO2 91%, low SpO2 79%.  Spent 23 min 32 sec with SpO2 < 88%.   Please let him know his overnight oxygen test is better while using 5 liters oxygen with his oral appliance, but he still has episodes when his oxygen level drops.  Please schedule a video visit with me to discuss options for improving his oxygen level at night.  Okay to double book visit if needed.

## 2023-01-27 ENCOUNTER — Encounter (HOSPITAL_BASED_OUTPATIENT_CLINIC_OR_DEPARTMENT_OTHER): Payer: Self-pay | Admitting: Pulmonary Disease

## 2023-01-27 ENCOUNTER — Telehealth (INDEPENDENT_AMBULATORY_CARE_PROVIDER_SITE_OTHER): Payer: Medicare Other | Admitting: Pulmonary Disease

## 2023-01-27 DIAGNOSIS — G4734 Idiopathic sleep related nonobstructive alveolar hypoventilation: Secondary | ICD-10-CM

## 2023-01-27 DIAGNOSIS — G4733 Obstructive sleep apnea (adult) (pediatric): Secondary | ICD-10-CM | POA: Diagnosis not present

## 2023-01-27 LAB — BASIC METABOLIC PANEL
BUN: 20 (ref 4–21)
CO2: 26 — AB (ref 13–22)
Chloride: 108 (ref 99–108)
Creatinine: 0.9 (ref 0.6–1.3)
Glucose: 154
Potassium: 4 mEq/L (ref 3.5–5.1)
Sodium: 146 (ref 137–147)

## 2023-01-27 LAB — HEPATIC FUNCTION PANEL
ALT: 33 U/L (ref 10–40)
AST: 25 (ref 14–40)
Alkaline Phosphatase: 121 (ref 25–125)
Bilirubin, Total: 0.5

## 2023-01-27 LAB — CBC AND DIFFERENTIAL
HCT: 50 (ref 41–53)
Hemoglobin: 17.2 (ref 13.5–17.5)
Platelets: 153 10*3/uL (ref 150–400)
WBC: 8.6

## 2023-01-27 LAB — LIPID PANEL
Cholesterol: 128 (ref 0–200)
HDL: 31 — AB (ref 35–70)
LDL Cholesterol: 27
LDl/HDL Ratio: 4.1
Triglycerides: 347 — AB (ref 40–160)

## 2023-01-27 LAB — COMPREHENSIVE METABOLIC PANEL
Albumin: 4.3 (ref 3.5–5.0)
Calcium: 9.5 (ref 8.7–10.7)
Globulin: 2.5
eGFR: 82

## 2023-01-27 LAB — HEMOGLOBIN A1C: Hemoglobin A1C: 7.7

## 2023-01-27 LAB — CBC: RBC: 5.67 — AB (ref 3.87–5.11)

## 2023-01-27 LAB — PSA: PSA: 3.4

## 2023-01-27 NOTE — Progress Notes (Signed)
[facility]  Chief Complaint  Patient presents with   Follow-up    Go over ono results    Constitutional:  There were no vitals taken for this visit.  Past Medical History:  HLD, HTN, Pre-DM  Past Surgical History:  He  has a past surgical history that includes Cholecystectomy and Tonsillectomy.  Brief Summary:  Steven Davies is a 82 y.o. male former smoker with COPD and obstructive sleep apnea.      Subjective:  Virtual Visit via Video Note  I connected with Stephanie Coup on 01/27/23 at  3:30 PM EDT by a video enabled telemedicine application and verified that I am speaking with the correct person using two identifiers.  Location: Patient: home Provider: medical office   I discussed the limitations of evaluation and management by telemedicine and the availability of in person appointments. The patient expressed understanding and agreed to proceed.  History of Present Illness:  His wife was present also.  His ONO showed long oxygen with oral appliance and 5 liters.  He had questions about POC's and why he is still having low oxygen at night.   Physical Exam:  Alert, speech fluent, no distress.      Pulmonary testing:  PFT 10/13/20 >> FEV1 1.90 (67%), FEV1% 56, TLC 5.87 (87%), DLCO 54% PFT 09/21/22 >> FEV1 1.83 (70%), FEV1% 64, TLC 5.67 (86%), DLCO 64%  Chest Imaging:  CT chest 07/26/18 >> LLL nodule was 14 mm in 2010 and now 2.5 mm, mild centrilobular emphysema CT chest 04/10/21 >> 2.8 x 2.0 x 2.5 cm Lt lung base hamartoma, fatty liver, atherosclerosis  Sleep Tests:  HST 05/06/21 >> AHI 13.4, SpO2 low 75%.  Spent 235.6 min with SpO2 < 89%. ONO with oral appliance 08/11/22 >> test time 6 hrs 39 min. Baseline SpO2 84%, low SpO2 74%. Spent 6 hrs 35 min with an SpO2 < 88%  ONO with 5 liters and oral appliance 12/29/22 >> test time 9 hrs 40 sec.  Baseline SpO2 91%, low SpO2 79%.  Spent 23 min 32 sec with SpO2 < 88%.    Social History:  He  reports that he has quit smoking. His smoking use included cigarettes. He has a 88.00 pack-year smoking history. He has never used smokeless tobacco. He reports current alcohol use of about 3.0 standard drinks of alcohol per week. He reports that he does not use drugs.  Family History:  His family history includes Heart attack in his father; Heart failure in his brother and mother.     Assessment/Plan:   Obstructive sleep apnea with nocturnal hypoxemia. - the combination of oral appliance and 5 liters oxygen hasn't been sufficient to control his oxygenation at night - discussed different options - will arrange for a CPAP titration study; will start the study without supplemental oxygen and add it in as needed - explained if CPAP isn't sufficient, then he might need Bipap - he should continue oral appliance and 5 liters oxygen for now  COPD with asthma and emphysema. - continue breo 200 one puff daily - prn albuterol  Obesity. 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 - Hb has been between 17.5 and 18.3 over the past 1 year - likely from hypoxemia  Time Spent Involved in Patient Care on Day of Examination:  38 minutes  Follow up:  I discussed the assessment and treatment plan with the patient. The  patient was provided an opportunity to ask questions and all were answered. The patient agreed with the plan and demonstrated an understanding of the instructions.   The patient was advised to call back or seek an in-person evaluation if the symptoms worsen or if the condition fails to improve as anticipated.  I provided 26 minutes of non-face-to-face time during this encounter.   Medication List:   Allergies as of 01/27/2023       Reactions   Sulfa Antibiotics         Medication List        Accurate as of January 27, 2023  3:20 PM. If you have any questions, ask your nurse or doctor.           amLODipine 10 MG tablet Commonly known as: NORVASC TAKE 1 TABLET BY MOUTH EVERY DAY   aspirin EC 81 MG tablet Take 81 mg by mouth daily. Swallow whole.   atorvastatin 10 MG tablet Commonly known as: LIPITOR TAKE 1 TABLET BY MOUTH EVERY DAY   Breo Ellipta 200-25 MCG/ACT Aepb Generic drug: fluticasone furoate-vilanterol TAKE 1 PUFF BY MOUTH EVERY DAY   finasteride 5 MG tablet Commonly known as: PROSCAR TAKE 1 TABLET BY MOUTH EVERY OTHER DAY   hydrochlorothiazide 12.5 MG capsule Commonly known as: MICROZIDE Take 1 capsule (12.5 mg total) by mouth every other day.   losartan 100 MG tablet Commonly known as: COZAAR TAKE 1 TABLET BY MOUTH EVERY DAY   metFORMIN 500 MG tablet Commonly known as: GLUCOPHAGE Take 500 mg by mouth daily.   OXYGEN Inhale into the lungs.   predniSONE 5 MG (21) Tbpk tablet Commonly known as: STERAPRED UNI-PAK 21 TAB   tamsulosin 0.4 MG Caps capsule Commonly known as: FLOMAX TAKE 1 CAPSULE BY MOUTH EVERY DAY        Signature:  Coralyn Helling, MD [facility] Pager - [phone] 01/27/2023, 3:20 PM

## 2023-01-27 NOTE — Telephone Encounter (Signed)
Pt called the office requesting to know if the ONO results were back. I went over the info with pt per Dr. Craige Cotta and also have scheduled pt a video visit with Dr.Sood today 4/25 at 3:30. Pt also requested to have the info sent to him in a mychart message so he is able to see what Dr. Craige Cotta was talking about in regards to the results so have sent a mychart message to pt of the info per Dr. Craige Cotta. Nothing further needed.

## 2023-02-01 ENCOUNTER — Encounter: Payer: Self-pay | Admitting: Internal Medicine

## 2023-02-01 ENCOUNTER — Non-Acute Institutional Stay: Payer: Medicare Other | Admitting: Internal Medicine

## 2023-02-01 VITALS — BP 134/88 | HR 86 | Temp 97.8°F | Resp 18 | Ht 68.0 in | Wt 217.4 lb

## 2023-02-01 DIAGNOSIS — G4733 Obstructive sleep apnea (adult) (pediatric): Secondary | ICD-10-CM

## 2023-02-01 DIAGNOSIS — R600 Localized edema: Secondary | ICD-10-CM | POA: Diagnosis not present

## 2023-02-01 DIAGNOSIS — I10 Essential (primary) hypertension: Secondary | ICD-10-CM | POA: Diagnosis not present

## 2023-02-01 DIAGNOSIS — G8929 Other chronic pain: Secondary | ICD-10-CM

## 2023-02-01 DIAGNOSIS — E1165 Type 2 diabetes mellitus with hyperglycemia: Secondary | ICD-10-CM | POA: Diagnosis not present

## 2023-02-01 DIAGNOSIS — E7800 Pure hypercholesterolemia, unspecified: Secondary | ICD-10-CM

## 2023-02-01 DIAGNOSIS — N401 Enlarged prostate with lower urinary tract symptoms: Secondary | ICD-10-CM

## 2023-02-01 DIAGNOSIS — D751 Secondary polycythemia: Secondary | ICD-10-CM

## 2023-02-01 DIAGNOSIS — J449 Chronic obstructive pulmonary disease, unspecified: Secondary | ICD-10-CM

## 2023-02-01 DIAGNOSIS — M545 Low back pain, unspecified: Secondary | ICD-10-CM

## 2023-02-01 NOTE — Progress Notes (Signed)
Location:  [facility]:  Clinic (12)  Provider:   Code Status:  Goals of Care:     02/01/2023   10:01 AM  Advanced Directives  Does Patient Have a Medical Advance Directive? Yes  Type of Estate agent of [place];Living will;Out of facility DNR (pink MOST or yellow form)  Does patient want to make changes to medical advance directive? No - Patient declined  Copy of Healthcare Power of Attorney in Chart? No - copy requested     Chief Complaint  Patient presents with   Medical Management of Chronic Issues    Patient is being seen for 3 month follow up and to discuss labs.   Immunizations    Discuss the need for shingles vaccine    Health Maintenance    Discussed the need for Microalbumin, Hep c screening, eye and foot exam    HPI: Patient is a 82 y.o. male seen today for medical management of chronic diseases.    Lives in [place] with his wife in [facility]  Sleep Apnea  Patient wears Mouth guard but now also using 5 litres of Oxygen  Dr Craige Cotta doing another Sleep study  Lung Nodule On Surveillance per pulmonary Diabetes Type 2 Has been Non Compliant with Metformin Also with his diet Not very active Back Pain Takes Advil 2-3 times a day  Has seen Dr Shelle Iron Next step MRI but waiting for his Apnea issue resolved first  Other issues   History of complicated renal cyst. 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 Swallow whole.   atorvastatin (LIPITOR) 10 MG tablet TAKE 1 TABLET BY MOUTH EVERY DAY   finasteride (PROSCAR) 5 MG tablet TAKE 1 TABLET BY MOUTH EVERY OTHER DAY   fluticasone furoate-vilanterol (BREO ELLIPTA) 200-25 MCG/ACT AEPB TAKE 1 PUFF BY MOUTH EVERY DAY   hydrochlorothiazide (MICROZIDE) 12.5 MG capsule Take 1 capsule (12.5 mg total) by mouth every other day.   losartan (COZAAR) 100 MG tablet TAKE 1 TABLET BY MOUTH EVERY DAY   metFORMIN (GLUCOPHAGE) 500 MG tablet Take 500 mg by mouth daily.   OXYGEN Inhale into the lungs.   tamsulosin (FLOMAX) 0.4 MG CAPS capsule TAKE 1 CAPSULE BY MOUTH EVERY DAY   [DISCONTINUED] predniSONE (STERAPRED UNI-PAK 21 TAB) 5 MG (21) TBPK tablet  (Patient not taking: Reported on 02/01/2023)   No facility-administered encounter medications on file as of 02/01/2023.    Review of Systems:  Review of Systems  Constitutional:  Negative for activity change, appetite change and unexpected  weight change.  HENT: Negative.    Respiratory:  Positive for shortness of breath. Negative for cough.   Cardiovascular:  Positive for leg swelling.  Gastrointestinal:  Negative for constipation.  Genitourinary:  Negative for frequency.  Musculoskeletal:  Negative for arthralgias, gait problem and myalgias.  Skin: Negative.  Negative for rash.  Neurological:  Negative for dizziness and weakness.  Psychiatric/Behavioral:  Negative for confusion and sleep disturbance.   All other systems reviewed and are negative.   Health Maintenance  Topic Date Due   Diabetic kidney evaluation - Urine ACR  Never done   Hepatitis C Screening  Never done   Zoster Vaccines-  Shingrix (2 of 2) 01/03/2023   FOOT EXAM  01/28/2023   OPHTHALMOLOGY EXAM  02/01/2023 (Originally 12/28/2022)   Medicare Annual Wellness (AWV)  02/10/2023 (Originally [birth year])   INFLUENZA VACCINE  05/05/2023   HEMOGLOBIN A1C  07/29/2023   Diabetic kidney evaluation - eGFR measurement  01/27/2024   Pneumonia Vaccine 33+ Years old  Completed   COVID-19 Vaccine  Completed   HPV VACCINES  Aged Out   DTaP/Tdap/Td  Discontinued    Physical Exam: Vitals:   02/01/23 0958  BP: 134/88  Pulse: 86  Resp: 18  Temp: 97.8 F (36.6 C)  TempSrc: Temporal  SpO2: 92%  Weight: 217 lb 6.4 oz (98.6 kg)  Height: 5\' 8"  (1.727 m)   Body mass index is 33.06 kg/m. Physical Exam Vitals reviewed.  Constitutional:      Appearance: Normal appearance.  HENT:     Head: Normocephalic.     Nose: Nose normal.     Mouth/Throat:     Mouth: Mucous membranes are moist.     Pharynx: Oropharynx is clear.  Eyes:     Pupils: Pupils are equal, round, and reactive to light.  Cardiovascular:     Rate and Rhythm: Normal rate and regular rhythm.     Pulses: Normal pulses.     Heart sounds: No murmur heard. Pulmonary:     Effort: Pulmonary effort is normal. No respiratory distress.     Breath sounds: Normal breath sounds. No rales.  Abdominal:     General: Abdomen is flat. Bowel sounds are normal.     Palpations: Abdomen is soft.  Musculoskeletal:     Cervical back: Neck supple.     Comments: Mild swelling Bilateral  Skin:    General: Skin is warm.  Neurological:     General: No focal deficit present.     Mental Status: He is alert and oriented to person, place, and time.  Psychiatric:        Mood and Affect: Mood normal.        Thought Content: Thought content normal.     Labs reviewed: Basic Metabolic Panel: Recent Labs    05/20/22 0000 10/12/22 0000 01/27/23 0000  NA 145 146 146  K 3.9 4.1 4.0  CL 107 108 108  CO2 24* 26* 26*  BUN 22* 18 20  CREATININE 1.1 1.3 0.9  CALCIUM 9.3 9.6 9.5    Liver Function Tests: Recent Labs    10/12/22 0000 01/27/23 0000  AST 29 25  ALT 31 33  ALKPHOS 110 121  ALBUMIN 4.1 4.3   No results for input(s): "LIPASE", "AMYLASE" in the last 8760 hours. No results for input(s): "AMMONIA" in the last 8760 hours. CBC: Recent Labs    10/12/22 0000 01/27/23 0000  WBC 7.5 8.6  HGB 17.5 17.2  HCT 52 50  PLT 149*  153   Lipid Panel: Recent Labs    01/27/23 0000  CHOL 128  HDL 31*  LDLCALC 27  TRIG 161*   Lab Results  Component Value Date   HGBA1C 7.7 01/27/2023    Procedures since last visit: No results found.  Assessment/Plan 1. Primary hypertension On Amlodipine and HCTZ Is going to discuss with Cardiology as has some edema in his legs most likely due to Norvasc  2. Obstructive sleep apnea syndrome Further studies per Dr Craige Cotta  3.Mild  Bilateral leg edema Most Likely Norvasc and Pulmonary Hypertension  4. Type 2 diabetes mellitus with hyperglycemia, without long-term current use of insulin (HCC) A1C elevated Non Compliant with Metformin Also was on Prednisone recently for back pain Will continue Metformin Diet and repeat in 3 months  5. Chronic obstructive pulmonary disease, unspecified COPD type (HCC)   6. Erythrocytosis Due to his Apnea  7. Chronic bilateral low back pain without sciatica Is going to follow with Orthopedics Taking Advil 2-3 times a day Again we talked about the side effects And take OTC pepcid for GI protection 8. Pure hypercholesterolemia LDL low ? Low dose statin Will discuss with Cardiology  9. Benign prostatic hyperplasia with lower urinary tract symptoms, symptom details unspecified Proscar and Flomax    Labs/tests ordered:  Lipid and A1C ,CBC,BMP Next appt:  02/01/2023

## 2023-02-16 ENCOUNTER — Ambulatory Visit (HOSPITAL_BASED_OUTPATIENT_CLINIC_OR_DEPARTMENT_OTHER): Payer: Medicare Other | Attending: Pulmonary Disease | Admitting: Pulmonary Disease

## 2023-02-16 ENCOUNTER — Encounter: Payer: Self-pay | Admitting: Pulmonary Disease

## 2023-02-16 DIAGNOSIS — G4733 Obstructive sleep apnea (adult) (pediatric): Secondary | ICD-10-CM | POA: Diagnosis not present

## 2023-02-16 DIAGNOSIS — G4734 Idiopathic sleep related nonobstructive alveolar hypoventilation: Secondary | ICD-10-CM | POA: Insufficient documentation

## 2023-02-18 ENCOUNTER — Other Ambulatory Visit: Payer: Self-pay | Admitting: Adult Health

## 2023-02-18 ENCOUNTER — Telehealth: Payer: Self-pay | Admitting: Pulmonary Disease

## 2023-02-18 DIAGNOSIS — G4733 Obstructive sleep apnea (adult) (pediatric): Secondary | ICD-10-CM

## 2023-02-18 DIAGNOSIS — G4734 Idiopathic sleep related nonobstructive alveolar hypoventilation: Secondary | ICD-10-CM

## 2023-02-18 NOTE — Procedures (Signed)
     Patient Name: Steven Davies, Steven Davies Date: 02/16/2023 Gender: Male D.O.B: [birth year] Age (years): 59 Referring Provider: Coralyn Helling MD, ABSM Height (inches): 68 Interpreting Physician: Coralyn Helling MD, ABSM Weight (lbs): 215 RPSGT: Armen Pickup BMI: 33 MRN: [id number] Neck Size: 17.50  CLINICAL INFORMATION The patient is referred for a CPAP titration to treat sleep apnea.  Date of HST 05/06/21: AHI 13.4, SpO2 low 75%.  SLEEP STUDY TECHNIQUE As per the AASM Manual for the Scoring of Sleep and Associated Events v2.3 (April 2016) with a hypopnea requiring 4% desaturations.  The channels recorded and monitored were frontal, central and occipital EEG, electrooculogram (EOG), submentalis EMG (chin), nasal and oral airflow, thoracic and abdominal wall motion, anterior tibialis EMG, snore microphone, electrocardiogram, and pulse oximetry. Continuous positive airway pressure (CPAP) was initiated at the beginning of the study and titrated to treat sleep-disordered breathing.  MEDICATIONS Medications self-administered by patient taken the night of the study : N/A  TECHNICIAN COMMENTS Comments added by technician: O2 initiated due to low sats. Patient had difficulty initiating sleep. Patient was restless all through the night. Comments added by scorer: N/A  RESPIRATORY PARAMETERS Optimal PAP Pressure (cm): 8 AHI at Optimal Pressure (/hr): 0 Overall Minimal O2 (%): 84.0 Supine % at Optimal Pressure (%): 18 Minimal O2 at Optimal Pressure (%): 84.0    He did well with CPAP 8 cm H2O, but still had oxygen desaturation < 88% lasting more than 5 minutes.  This resolved once he had 3 liters supplemental oxygen added in with CPAP.  SLEEP ARCHITECTURE The study was initiated at 10:37:36 PM and ended at 5:05:19 AM.  Sleep onset time was 144.2 minutes and the sleep efficiency was 44.1%. The total sleep time was 171 minutes.  The patient spent 8.2% of the night in stage N1 sleep, 64.9% in  stage N2 sleep, 0.0% in stage N3 and 26.9% in REM.Stage REM latency was 179.5 minutes  Wake after sleep onset was 72.5. Alpha intrusion was absent. Supine sleep was 18.42%.  CARDIAC DATA The 2 lead EKG demonstrated sinus rhythm. The mean heart rate was 64.6 beats per minute. Other EKG findings include: None.  LEG MOVEMENT DATA The total Periodic Limb Movements of Sleep (PLMS) were 0. The PLMS index was 0.0. A PLMS index of <15 is considered normal in adults.  IMPRESSIONS - He did well with CPAP 8 cm H2O and 3 liters supplemental oxygen.  He was observed in REM and supine sleep with these settings.  DIAGNOSIS - Obstructive Sleep Apnea  - Sleep Related Hypoxia  RECOMMENDATIONS - Trial of CPAP therapy on 8 cm H2O with 3 liters supplemental oxygen. - He was fitted with a Large size [employer] Full Face Simplus mask and heated humidification. - Avoid alcohol, sedatives and other CNS depressants that may worsen sleep apnea and disrupt normal sleep architecture. - Sleep hygiene should be reviewed to assess factors that may improve sleep quality. - Weight management and regular exercise should be initiated or continued.  [Electronically signed] 02/18/2023 09:38 PM  Coralyn Helling MD, ABSM Diplomate, American Board of Sleep Medicine NPI: [id number]  [facility] PH: [phone]   FX: [phone] ACCREDITED BY THE AMERICAN ACADEMY OF SLEEP MEDICINE

## 2023-02-18 NOTE — Telephone Encounter (Signed)
CPAP titration 02/16/23 >> CPAP 8 cm H2O with 3 liters >> AHI 0, +R, +S   Please let him know he did well during the sleep study once he was on CPAP 8 cm H2O with 3 liters supplemental oxygen.  Please send an order to have him get set up with a Resmed CPAP at 8 cm H2O with heated humidity and mask of choice.  He already has supplemental oxygen, and needs to have 3 liters oxygen added in at night with CPAP.  Please arrange for follow up in 4 months.

## 2023-02-22 ENCOUNTER — Other Ambulatory Visit: Payer: Self-pay

## 2023-02-22 MED ORDER — HYDROCHLOROTHIAZIDE 12.5 MG PO CAPS: 12.5000 mg | ORAL_CAPSULE | ORAL | 1 refills | Status: DC

## 2023-02-23 NOTE — Telephone Encounter (Signed)
Patient would like results of sleep test. Patient phone number is [phone].

## 2023-02-24 ENCOUNTER — Other Ambulatory Visit: Payer: Self-pay | Admitting: Internal Medicine

## 2023-02-24 ENCOUNTER — Other Ambulatory Visit: Payer: Self-pay

## 2023-02-24 MED ORDER — HYDROCHLOROTHIAZIDE 12.5 MG PO CAPS: 12.5000 mg | ORAL_CAPSULE | ORAL | 3 refills | Status: DC

## 2023-03-02 ENCOUNTER — Encounter (HOSPITAL_BASED_OUTPATIENT_CLINIC_OR_DEPARTMENT_OTHER): Payer: Self-pay | Admitting: Cardiology

## 2023-03-02 ENCOUNTER — Ambulatory Visit (INDEPENDENT_AMBULATORY_CARE_PROVIDER_SITE_OTHER): Payer: Medicare Other | Admitting: Cardiology

## 2023-03-02 VITALS — BP 114/80 | HR 93 | Ht 68.0 in | Wt 215.1 lb

## 2023-03-02 DIAGNOSIS — E119 Type 2 diabetes mellitus without complications: Secondary | ICD-10-CM

## 2023-03-02 DIAGNOSIS — I10 Essential (primary) hypertension: Secondary | ICD-10-CM

## 2023-03-02 DIAGNOSIS — Z7984 Long term (current) use of oral hypoglycemic drugs: Secondary | ICD-10-CM

## 2023-03-02 DIAGNOSIS — E782 Mixed hyperlipidemia: Secondary | ICD-10-CM | POA: Diagnosis not present

## 2023-03-02 DIAGNOSIS — R0609 Other forms of dyspnea: Secondary | ICD-10-CM | POA: Diagnosis not present

## 2023-03-02 DIAGNOSIS — R600 Localized edema: Secondary | ICD-10-CM

## 2023-03-02 NOTE — Telephone Encounter (Signed)
Called and spoke with pt letting him know the results of the titration study and recs per VS. Pt verbalized understanding. Order placed for cpap start. Nothing further needed.

## 2023-03-02 NOTE — Patient Instructions (Signed)
Medication Instructions:  Your physician recommends that you continue on your current medications as directed. Please refer to the Current Medication list given to you today.  *If you need a refill on your cardiac medications before your next appointment, please call your pharmacy*  Lab Work: NONE  Testing/Procedures: Your physician has requested that you have an echocardiogram. Echocardiography is a painless test that uses sound waves to create images of your heart. It provides your doctor with information about the size and shape of your heart and how well your heart's chambers and valves are working. This procedure takes approximately one hour. There are no restrictions for this procedure. Please do NOT wear cologne, perfume, aftershave, or lotions (deodorant is allowed). Please arrive 15 minutes prior to your appointment time.  Follow-Up: At [facility], you and your health needs are our priority.  As part of our continuing mission to provide you with exceptional heart care, we have created designated Provider Care Teams.  These Care Teams include your primary Cardiologist (physician) and Advanced Practice Providers (APPs -  Physician Assistants and Nurse Practitioners) who all work together to provide you with the care you need, when you need it.  We recommend signing up for the patient portal called "MyChart".  Sign up information is provided on this After Visit Summary.  MyChart is used to connect with patients for Virtual Visits (Telemedicine).  Patients are able to view lab/test results, encounter notes, upcoming appointments, etc.  Non-urgent messages can be sent to your provider as well.   To learn more about what you can do with MyChart, go to [email or web address].    Your next appointment:   12 month(s)  The format for your next appointment:   In Person  Provider:   Jodelle Red, MD

## 2023-03-02 NOTE — Progress Notes (Signed)
Cardiology Office Note:    Date:  03/02/2023   ID:  Steven Davies, DOB 02-28-1941, MRN [id number]  PCP:  Mahlon Gammon, MD  Cardiologist:  Jodelle Red, MD  Referring MD: Mahlon Gammon, MD   CC: new patient evaluation for shortness of breath  History of Present Illness:    Steven Davies is a 82 y.o. male with a hx of hypertension, COPD, OSA, type 2 diabetes who is seen as a new consult at the request of Mahlon Gammon, MD for the evaluation and management of shortness of breath and LE edema.  Note from Dr. Chales Abrahams dated 01/11/23 reviewed.  Referred to cardiology for possible echo to evaluate shortness of breath and LE edema  Today: Notes 15 years of rare, intermittent spiky pain, unrelated to exertion. Discussed today.   Cardiovascular risk factors: Prior clinical ASCVD: none Comorbid conditions: hypertension for ~30 years, hyperlipidemia for ~30 years (statins). Recently diagnosed with diabetes, most recent A1c 7.7. OSA with recent sleep study, awaiting results. Denies chronic kidney disease  Metabolic syndrome/Obesity: BMI 32 currently. Peak adult weight >220 lbs Chronic inflammatory conditions: none Tobacco use history: Yes, quit 2008 (44 years x 2 ppd) Family history: father had atherosclerosis, brother had CABG, heart failure.  Exercise level: water exercises for 45 min twice a week. Back limits walking  Reports chronic shortness of breath. Has felt better with start of Breo ellipta 5 years ago. Can walk up hills and stairs as long as not carrying a lot of weight.  Was seen by pulmonologist in [place], has heart evaluation in 2021 in [place]. Had CT scan done, does not recall being told anything was abnormal. Had echocardiogram >20 years ago. Had treadmill stress remotely, does not recall any abnormalities.  Feels that breathing was getting better for a time, then in the last 6-8 months has been more limited by back/joint pain.  Has mild intermittent LE edema  over the last year or so.  Denies shortness of breath at rest, PND, orthopnea, or unexpected weight gain. No syncope or palpitations.    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 Swallow whole.   atorvastatin (LIPITOR) 10 MG tablet TAKE 1 TABLET BY MOUTH EVERY DAY   BREO ELLIPTA 200-25 MCG/ACT AEPB INHALE 1 PUFF BY MOUTH EVERY DAY   finasteride (PROSCAR) 5 MG tablet TAKE 1 TABLET BY MOUTH EVERY OTHER DAY   hydrochlorothiazide (MICROZIDE) 12.5 MG capsule Take 1 capsule (12.5 mg total) by mouth every other day.   losartan (COZAAR) 100 MG tablet TAKE 1 TABLET BY MOUTH EVERY DAY   metFORMIN (GLUCOPHAGE) 500 MG tablet Take 500 mg by mouth daily.   OXYGEN Inhale into the lungs.   tamsulosin (FLOMAX) 0.4 MG CAPS capsule TAKE 1 CAPSULE BY MOUTH EVERY DAY   No current facility-administered medications on file prior to visit.     Allergies:   Sulfa antibiotics  Social History   Tobacco Use   Smoking status: Former    Packs/day: 2.00    Years: 44.00    Additional pack years: 0.00    Total pack years: 88.00    Types: Cigarettes   Smokeless tobacco: Never  Vaping Use   Vaping Use: Never used  Substance Use Topics   Alcohol use: Yes    Alcohol/week: 3.0 standard drinks of alcohol    Types: 3 Glasses of wine per week   Drug use: Never    Family History: family history includes Heart attack in his father; Heart failure in his brother and mother.  ROS:   Please see the history  of present illness.  Additional pertinent ROS: Constitutional: Negative for chills, fever, night sweats, unintentional weight loss  HENT: Negative for ear pain and hearing loss.   Eyes: Negative for loss of vision and eye pain.  Respiratory: Negative for cough, sputum, wheezing.   Cardiovascular: See HPI. Gastrointestinal: Negative for abdominal pain, melena, and hematochezia.  Genitourinary: Negative for dysuria and hematuria.  Musculoskeletal: Negative for falls and myalgias.  Skin: Negative for itching and rash.  Neurological: Negative for focal weakness, focal sensory changes and loss of consciousness.  Endo/Heme/Allergies: Does not bruise/bleed easily.     EKGs/Labs/Other Studies Reviewed:    The following studies were reviewed today: Old records not available today  EKG:  EKG is personally reviewed.   03/02/2023: SR at 93 bpm, PVC  Recent Labs: 01/27/2023: ALT 33; BUN 20; Creatinine 0.9; Hemoglobin 17.2; Platelets 153; Potassium 4.0; Sodium 146  Recent Lipid Panel    Component Value Date/Time   CHOL 128 01/27/2023 0000   TRIG 347 (A) 01/27/2023 0000   HDL 31 (A) 01/27/2023 0000   LDLCALC 27 01/27/2023 0000    Physical Exam:    VS:  BP 114/80 (BP Location: Left Arm, Patient Position: Sitting, Cuff Size: Normal)   Pulse 93   Ht 5\' 8"  (1.727 m)   Wt 215 lb 1.6 oz (97.6 kg)   BMI 32.71 kg/m     Wt Readings from Last 3 Encounters:  03/02/23 215 lb 1.6 oz (97.6 kg)  02/16/23 215 lb (97.5 kg)  02/01/23 217 lb 6.4 oz (98.6 kg)    GEN: Well nourished, well developed in no acute distress HEENT: Normal, moist mucous membranes NECK: No JVD CARDIAC: regular rhythm, normal S1 and S2, no rubs or gallops. No murmur. VASCULAR: Radial and DP pulses 2+ bilaterally. No carotid bruits RESPIRATORY:  Clear to auscultation without rales, wheezing or rhonchi  ABDOMEN: Soft, non-tender, non-distended MUSCULOSKELETAL:  Ambulates independently SKIN: Warm and dry, no edema NEUROLOGIC:   Alert and oriented x 3. No focal neuro deficits noted. PSYCHIATRIC:  Normal affect    ASSESSMENT:    1. Dyspnea on exertion   2. Primary hypertension   3. Type 2 diabetes mellitus without complication, without long-term current use of insulin (HCC)   4. Mixed hyperlipidemia   5. Bilateral leg edema    PLAN:    Dyspnea on exertion Bilateral LE edema COPD -will get echo to evaluate for structural abnormalities  Type II diabetes Mixed hyperlipidemia -continue aspirin, atorvastatin  Hypertension -at goal today, continue hydrochlorothiazide and losartan  Cardiac risk counseling and prevention recommendations: -recommend heart healthy/Mediterranean diet, with whole grains, fruits, vegetable, fish, lean meats, nuts, and olive oil. Limit salt. -recommend moderate walking, 3-5 times/week for 30-50 minutes each session. Aim for at least 150 minutes.week. Goal should be pace of 3 miles/hours, or walking  1.5 miles in 30 minutes -recommend avoidance of tobacco products. Avoid excess alcohol.  Plan for follow up: 1 year or sooner as needed  Jodelle Red, MD, PhD, [facility] [place]  [facility]  [place]  [facility] V[facility] at [facility] [address] [place], [place] [address] [phone]   Signed, Jodelle Red, MD PhD 03/02/2023     [facility]

## 2023-03-02 NOTE — Telephone Encounter (Signed)
Pt is calling back to go over his Results PLS advise

## 2023-03-07 ENCOUNTER — Other Ambulatory Visit (HOSPITAL_BASED_OUTPATIENT_CLINIC_OR_DEPARTMENT_OTHER): Payer: Self-pay

## 2023-03-07 MED ORDER — SHINGRIX 50 MCG/0.5ML IM SUSR
0.5000 mL | Freq: Once | INTRAMUSCULAR | 0 refills | Status: AC
  Filled 2023-03-07: qty 0.5, 1d supply, fill #0

## 2023-03-08 ENCOUNTER — Telehealth: Payer: Self-pay | Admitting: Pulmonary Disease

## 2023-03-08 DIAGNOSIS — J9611 Chronic respiratory failure with hypoxia: Secondary | ICD-10-CM

## 2023-03-08 NOTE — Telephone Encounter (Signed)
Patient would like order for portable oxygen concentrator. Patient uses [facility]. Patient phone number is [phone].

## 2023-03-09 NOTE — Telephone Encounter (Signed)
ATC patient. LVMTCB. 

## 2023-03-10 NOTE — Telephone Encounter (Signed)
Patient returning phone call 

## 2023-03-11 NOTE — Telephone Encounter (Signed)
Called and spoke with patient. He verbalized understanding. POC has been ordered.   Nothing further needed at time of call.

## 2023-03-11 NOTE — Telephone Encounter (Signed)
Called the pt back and there was no answer- LMTCB   He will need appt- does not already use o2 during the day. We are not able to just prescribe this without appt and proving he needs during the day. We do not prescribe POC for night time o2 use. When he calls back needs appt.

## 2023-03-11 NOTE — Telephone Encounter (Signed)
Patient returning phone call. 3rd time today.

## 2023-03-11 NOTE — Telephone Encounter (Signed)
Okay to send order for a new POC.  He uses 2 to 3 liters oxygen prn with exertion.

## 2023-03-11 NOTE — Telephone Encounter (Signed)
Called and spoke with patient. Patient stated he needed a new order for portable oxygen  concentrator. Dr. Craige Cotta, please advise if you are okay with order being sent for portable oxygen  concentrator.

## 2023-03-14 ENCOUNTER — Telehealth: Payer: Self-pay | Admitting: Pulmonary Disease

## 2023-03-14 NOTE — Telephone Encounter (Signed)
I received a message back from [place] with [employer] "RE: New POC setting 2-3 pulsed dose for exertion Received: Today Steven Davies, Pincus Large, Tomie China; Kathe Becton Patient is only on O2 during sleep they do not qualify for a POC. patient would not qualify for POC due to being nocturnal.

## 2023-03-14 NOTE — Telephone Encounter (Signed)
Please contact the patient.  He might be willing to pay for a POC out of pocket.

## 2023-03-15 NOTE — Telephone Encounter (Signed)
I have forwarded this information to Ramah with [employer] for her to process the POC order

## 2023-03-15 NOTE — Telephone Encounter (Signed)
Pt called in bc he has bee getting the run around trying to get a POC. Informed pt they do not qualify for a POC due to being nocturnal.  Pt then expressed that he does a lot of traveling and will need one when he's on vacation. Pt is also willing to pay out of pocket of a POC all he needs is a prescription sent on over to [facility].

## 2023-03-16 ENCOUNTER — Telehealth: Payer: Self-pay | Admitting: *Deleted

## 2023-03-16 ENCOUNTER — Telehealth: Payer: Self-pay | Admitting: Pulmonary Disease

## 2023-03-16 NOTE — Telephone Encounter (Signed)
   Casimiro Needle from [employer] is calling asking for [employer].   States that to to start a case for service he needs the face sheet or demographics on this PT please.    Fax # [phone] His CB # is [phone] Casimiro Needle w/[employer]

## 2023-03-16 NOTE — Telephone Encounter (Signed)
Called and spoke with Melissa, she states that if he wants to pay out of pocket for a POC.  She stated all we need to do is send an order for the specific pulsed oxygen liter flow and that he wants to pay out of pocket.  Advised I would call and speak with the patient and then call her back.  Called and spoke with patient, he wants the POC for travel to hook up to his CPAP machine.  Advised that is is not what the POC was designed for, it was designed for people that need exertional oxygen when they walk or are active.  I let him know that it is pulsed oxygen, not continuous flow and it depends on the person to tell it to give the oxygen by breathing in through the nose.  Advised he could contact his DME when he travels and arrange oxygen to be set up where he is going.  He said he did not want to do that, he wants to pay out of pocket (no insurance) and have oxygen to travel when he wants to travel.  Advised he could go with [employer] as they have a travel friendly concentrator that can be moved easily, packed in a suitcase and put on a plane if needed.  He asked that we move forward with that plan.  I told him I would contact our person at [employer] and then give him a call back.  He verbalized understanding.  I called Minda Ditto at [phone] and had to leave a VM that patient wanted to pay out of pocket for a concentrator to use with his CPAP at 3L and to be able to travel with it.  I provided his information including his name, dob and phone # to contact the patient directly.  Left # for return call to the office.  Called and spoke with the patient, advised that I left a message for Minda Ditto and that he should be contacting him regarding his options for traveling with oxygen for his CPAP machine.  He was appreciative.  Will await communication from Casimiro Needle with [employer].

## 2023-03-17 NOTE — Telephone Encounter (Signed)
Demographics faxed to Casimiro Needle.

## 2023-03-18 ENCOUNTER — Other Ambulatory Visit: Payer: Self-pay | Admitting: Internal Medicine

## 2023-03-22 ENCOUNTER — Telehealth (HOSPITAL_BASED_OUTPATIENT_CLINIC_OR_DEPARTMENT_OTHER): Payer: Self-pay | Admitting: Pulmonary Disease

## 2023-03-22 NOTE — Telephone Encounter (Signed)
[employer] states needs the address written on the order. It has been handled. Nothing further needed.

## 2023-04-19 ENCOUNTER — Ambulatory Visit (INDEPENDENT_AMBULATORY_CARE_PROVIDER_SITE_OTHER): Payer: Medicare Other

## 2023-04-19 ENCOUNTER — Other Ambulatory Visit (HOSPITAL_BASED_OUTPATIENT_CLINIC_OR_DEPARTMENT_OTHER): Payer: Self-pay

## 2023-04-19 DIAGNOSIS — R600 Localized edema: Secondary | ICD-10-CM

## 2023-04-19 DIAGNOSIS — I10 Essential (primary) hypertension: Secondary | ICD-10-CM

## 2023-04-19 DIAGNOSIS — I77810 Thoracic aortic ectasia: Secondary | ICD-10-CM

## 2023-04-19 DIAGNOSIS — R0609 Other forms of dyspnea: Secondary | ICD-10-CM

## 2023-04-19 LAB — ECHOCARDIOGRAM COMPLETE
AR max vel: 2.31 cm2
AV Area VTI: 2.34 cm2
AV Area mean vel: 2.35 cm2
AV Mean grad: 4 mmHg
AV Peak grad: 7.4 mmHg
Ao pk vel: 1.36 m/s
Area-P 1/2: 3.42 cm2
Calc EF: 62.2 %
S' Lateral: 3.87 cm
Single Plane A2C EF: 60.6 %
Single Plane A4C EF: 64.8 %

## 2023-04-20 ENCOUNTER — Other Ambulatory Visit: Payer: Self-pay | Admitting: Internal Medicine

## 2023-04-20 NOTE — Telephone Encounter (Signed)
High allergy warning came up when trying to refill medication.  Medication pended and sent to Dr. Einar Crow

## 2023-05-03 ENCOUNTER — Encounter: Payer: Self-pay | Admitting: Internal Medicine

## 2023-05-03 ENCOUNTER — Non-Acute Institutional Stay: Payer: Medicare Other | Admitting: Internal Medicine

## 2023-05-03 ENCOUNTER — Other Ambulatory Visit: Payer: Self-pay | Admitting: Internal Medicine

## 2023-05-03 ENCOUNTER — Ambulatory Visit (HOSPITAL_BASED_OUTPATIENT_CLINIC_OR_DEPARTMENT_OTHER)
Admission: RE | Admit: 2023-05-03 | Discharge: 2023-05-03 | Disposition: A | Discharge status: Home / Self Care | Payer: Medicare Other | Source: Ambulatory Visit | Attending: Internal Medicine | Admitting: Internal Medicine

## 2023-05-03 VITALS — BP 130/80 | HR 110 | Temp 98.2°F | Resp 18 | Ht 68.0 in | Wt 213.0 lb

## 2023-05-03 DIAGNOSIS — R059 Cough, unspecified: Secondary | ICD-10-CM | POA: Insufficient documentation

## 2023-05-03 DIAGNOSIS — B309 Viral conjunctivitis, unspecified: Secondary | ICD-10-CM | POA: Diagnosis not present

## 2023-05-03 DIAGNOSIS — I10 Essential (primary) hypertension: Secondary | ICD-10-CM

## 2023-05-03 DIAGNOSIS — E1165 Type 2 diabetes mellitus with hyperglycemia: Secondary | ICD-10-CM

## 2023-05-03 DIAGNOSIS — G4733 Obstructive sleep apnea (adult) (pediatric): Secondary | ICD-10-CM

## 2023-05-03 DIAGNOSIS — J449 Chronic obstructive pulmonary disease, unspecified: Secondary | ICD-10-CM

## 2023-05-03 LAB — POC COVID19 BINAXNOW: SARS Coronavirus 2 Ag: NEGATIVE

## 2023-05-03 MED ORDER — PREDNISONE 20 MG PO TABS: 20.0000 mg | ORAL_TABLET | Freq: Every day | ORAL | 0 refills | Status: DC

## 2023-05-03 MED ORDER — AMOXICILLIN-POT CLAVULANATE 500-125 MG PO TABS: 1.0000 | ORAL_TABLET | Freq: Three times a day (TID) | ORAL | 0 refills | Status: AC

## 2023-05-03 MED ORDER — OFLOXACIN 0.3 % OP SOLN: 1.0000 [drp] | Freq: Four times a day (QID) | OPHTHALMIC | 0 refills | Status: DC

## 2023-05-03 NOTE — Progress Notes (Signed)
Xray was negative will treat like Acute Bronchitis

## 2023-05-03 NOTE — Progress Notes (Signed)
-  Location: [facility] features editor of Service:  Clinic (12)  Provider:   Code Status:  Goals of Care:     05/03/2023    9:29 AM  Advanced Directives  Does Patient Have a Medical Advance Directive? Yes  Type of Estate agent of [place];Out of facility DNR (pink MOST or yellow form);Living will  Copy of Healthcare Power of Attorney in Chart? No - copy requested     Chief Complaint  Patient presents with   Acute Visit    Patient states he had a cold last Wednesday but the cold coug and congestion is not getting beter    HPI: Patient is a 82 y.o. male seen today for an acute visit for Cough and Congestion with low grade fever Lives in [place] with his wife in [facility]   Starting Coughing few days ago with Severe sore Throat Low grade temp in the evening Productive cough No Chest pain ? SOB with Exertion it is his baseline also  Also having discharge from his eyes They are Matted  in the morning Red and Itchy No Vision issues  Other issues Sleep Apnea  Using CPAP past one month which is helping  Lung Nodule On Surveillance per pulmonary  Diabetes Type 2 Has been Non Compliant with Metformin Also with his diet Not very active Back Pain Takes Advil 2-3 times a day  Has seen Dr Shelle Iron Next step MRI    History of complicated renal cyst. 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 Swallow whole.   atorvastatin (LIPITOR) 10 MG tablet TAKE 1 TABLET BY MOUTH EVERY DAY   BREO ELLIPTA 200-25 MCG/ACT AEPB INHALE 1 PUFF BY MOUTH EVERY DAY   finasteride (PROSCAR) 5 MG tablet TAKE 1 TABLET BY MOUTH EVERY OTHER DAY   hydrochlorothiazide (MICROZIDE) 12.5 MG capsule Take 1 capsule (12.5 mg total) by mouth every other day.   losartan (COZAAR) 100 MG tablet TAKE 1 TABLET BY MOUTH EVERY DAY   metFORMIN (GLUCOPHAGE) 500 MG tablet TAKE 1 TABLET BY MOUTH 2 TIMES DAILY WITH A MEAL.   ofloxacin (OCUFLOX) 0.3 % ophthalmic solution Place 1 drop into both eyes 4 (four) times daily.   OXYGEN Inhale into the lungs.   tamsulosin (FLOMAX) 0.4 MG CAPS capsule TAKE 1 CAPSULE BY MOUTH EVERY DAY   No facility-administered encounter medications on file as of 05/03/2023.    Review of Systems:  Review of Systems  Constitutional:  Positive for activity change. Negative for appetite change and unexpected weight change.  HENT: Negative.  Eyes:  Positive for discharge, redness and itching. Negative for visual disturbance.  Respiratory:  Positive for cough and shortness of breath.   Cardiovascular:  Negative for leg swelling.  Gastrointestinal:  Negative for constipation.  Genitourinary:  Negative for frequency.  Musculoskeletal:  Negative for arthralgias, gait problem and myalgias.  Skin: Negative.  Negative for rash.  Neurological:  Negative for dizziness and weakness.  Psychiatric/Behavioral:  Positive for sleep disturbance. Negative for confusion.   All other systems reviewed and are negative.   Health Maintenance  Topic Date Due   Medicare Annual Wellness (AWV)  Never done   Diabetic kidney evaluation - Urine ACR  Never done    Hepatitis C Screening  Never done   OPHTHALMOLOGY EXAM  12/28/2022   FOOT EXAM  01/28/2023   INFLUENZA VACCINE  05/05/2023   COVID-19 Vaccine (8 - 2023-24 season) 05/29/2023   HEMOGLOBIN A1C  07/29/2023   Diabetic kidney evaluation - eGFR measurement  01/27/2024   Pneumonia Vaccine 90+ Years old  Completed   Zoster Vaccines- Shingrix  Completed   HPV VACCINES  Aged Out   DTaP/Tdap/Td  Discontinued    Physical Exam: Vitals:   05/03/23 0929 05/03/23 0930  BP: 130/80   Pulse: (!) 110   Resp: 18   Temp: 98.2 F (36.8 C)   TempSrc: Temporal   SpO2: 90% (!) 89%  Weight: 213 lb (96.6 kg)   Height: 5\' 8"  (1.727 m)    Body mass index is 32.39 kg/m. Physical Exam Vitals reviewed.  Constitutional:      Appearance: Normal appearance.  HENT:     Head: Normocephalic.     Nose: Congestion present.     Mouth/Throat:     Mouth: Mucous membranes are moist.     Pharynx: Oropharynx is clear. Posterior oropharyngeal erythema present.  Eyes:     Pupils: Pupils are equal, round, and reactive to light.     Comments: Mild Congestion bilateral  Cardiovascular:     Rate and Rhythm: Normal rate and regular rhythm.     Pulses: Normal pulses.     Heart sounds: No murmur heard. Pulmonary:     Effort: Pulmonary effort is normal.     Comments: Rales present in left Lower chest No Wheezing Abdominal:     General: Abdomen is flat. Bowel sounds are normal.     Palpations: Abdomen is soft.  Musculoskeletal:        General: No swelling.     Cervical back: Neck supple.  Skin:    General: Skin is warm.  Neurological:     General: No focal deficit present.     Mental Status: He is alert and oriented to person, place, and time.  Psychiatric:        Mood and Affect: Mood normal.        Thought Content: Thought content normal.     Labs reviewed: Basic Metabolic Panel: Recent Labs    05/20/22 0000 10/12/22 0000 01/27/23 0000  NA 145 146 146  K 3.9 4.1 4.0  CL 107 108 108  CO2 24*  26* 26*  BUN 22* 18 20  CREATININE 1.1 1.3 0.9  CALCIUM 9.3 9.6 9.5   Liver Function Tests: Recent Labs    10/12/22 0000 01/27/23 0000  AST 29 25  ALT 31 33  ALKPHOS 110 121  ALBUMIN 4.1 4.3   No results for input(s): "LIPASE", "AMYLASE" in the last 8760 hours. No results for input(s): "AMMONIA" in the last 8760 hours.  CBC: Recent Labs    10/12/22 0000 01/27/23 0000  WBC 7.5 8.6  HGB 17.5 17.2  HCT 52 50  PLT 149* 153   Lipid Panel: Recent Labs    01/27/23 0000  CHOL 128  HDL 31*  LDLCALC 27  TRIG 782*   Lab Results  Component Value Date   HGBA1C 7.7 01/27/2023    Procedures since last visit: ECHOCARDIOGRAM COMPLETE  Result Date: 04/19/2023    ECHOCARDIOGRAM REPORT   Patient Name:   Steven Davies [facility] Date of Exam: 04/19/2023 Medical Rec #:  [id number]       Height:       68.0 in Accession #:    [id number]      Weight:       215.1 lb Date of Birth:  [birth year]      BSA:          2.108 m Patient Age:    62 years        BP:           114/80 mmHg Patient Gender: M               HR:           76 bpm. Exam Location:  Outpatient Procedure: 2D Echo, 3D Echo, Cardiac Doppler and Color Doppler Indications:    R06.9 DOE; R60.0 Lower extremity edema  History:        Patient has no prior history of Echocardiogram examinations.                 COPD, Signs/Symptoms:Dyspnea and Edema; Risk                 Factors:Hypertension, Diabetes, Sleep Apnea, Dyslipidemia and                 Former Smoker. Patient denies chest pain. He does have DOE and                 bilateral leg edema.  Sonographer:    Carlos American RVT, RDCS (AE), RDMS Referring Phys: [phone] [facility] CHRISTOPHER  Sonographer Comments: Patient is obese. Image acquisition challenging due to COPD and Image acquisition challenging due to patient body habitus. Global longitudinal strain was attempted. IMPRESSIONS  1. Left ventricular ejection fraction, by estimation, is 55 to 60%. The left ventricle has normal function. The left  ventricle has no regional wall motion abnormalities. Left ventricular diastolic parameters are consistent with Grade I diastolic dysfunction (impaired relaxation). Elevated left atrial pressure.  2. Right ventricular systolic function is normal. The right ventricular size is normal. There is normal pulmonary artery systolic pressure.  3. Left atrial size was moderately dilated.  4. The mitral valve is normal in structure. Mild mitral valve regurgitation. No evidence of mitral stenosis.  5. The aortic valve is tricuspid. There is mild calcification of the aortic valve. There is mild thickening of the aortic valve. Aortic valve regurgitation is not visualized. Aortic valve sclerosis/calcification is present, without any evidence of aortic stenosis.  6. Aortic dilatation noted. There is borderline dilatation of the aortic root and of the ascending aorta, measuring 39 mm. There is mild dilatation of the abdominal aorta, measuring 32 mm.  7. The inferior vena cava is dilated in size with >50% respiratory variability, suggesting right atrial pressure of 8 mmHg. FINDINGS  Left Ventricle: Left ventricular ejection fraction, by estimation, is 55 to 60%. The left ventricle has normal function. The left ventricle has no regional wall motion abnormalities. 3D  ejection fraction reviewed and evaluated as part of the interpretation. Alternate measurement of EF is felt to be most reflective of LV function. The left ventricular internal cavity size was normal in size. There is borderline concentric left ventricular hypertrophy. Left ventricular diastolic parameters are  consistent with Grade I diastolic dysfunction (impaired relaxation). Elevated left atrial pressure. Right Ventricle: The right ventricular size is normal. No increase in right ventricular wall thickness. Right ventricular systolic function is normal. There is normal pulmonary artery systolic pressure. The tricuspid regurgitant velocity is 2.11 m/s, and  with an  assumed right atrial pressure of 8 mmHg, the estimated right ventricular systolic pressure is 25.8 mmHg. Left Atrium: Left atrial size was moderately dilated. Right Atrium: Right atrial size was normal in size. Pericardium: There is no evidence of pericardial effusion. Mitral Valve: The mitral valve is normal in structure. Mild mitral valve regurgitation, with centrally-directed jet. No evidence of mitral valve stenosis. Tricuspid Valve: The tricuspid valve is normal in structure. Tricuspid valve regurgitation is not demonstrated. No evidence of tricuspid stenosis. Aortic Valve: The aortic valve is tricuspid. There is mild calcification of the aortic valve. There is mild thickening of the aortic valve. Aortic valve regurgitation is not visualized. Aortic valve sclerosis/calcification is present, without any evidence of aortic stenosis. Aortic valve mean gradient measures 4.0 mmHg. Aortic valve peak gradient measures 7.4 mmHg. Aortic valve area, by VTI measures 2.34 cm. Pulmonic Valve: The pulmonic valve was grossly normal. Pulmonic valve regurgitation is not visualized. No evidence of pulmonic stenosis. Aorta: Aortic dilatation noted. There is borderline dilatation of the aortic root and of the ascending aorta, measuring 39 mm. There is mild dilatation of the abdominal aorta, measuring 32 mm. Venous: The inferior vena cava is dilated in size with greater than 50% respiratory variability, suggesting right atrial pressure of 8 mmHg. IAS/Shunts: No atrial level shunt detected by color flow Doppler.  LEFT VENTRICLE PLAX 2D LVIDd:         5.23 cm      Diastology LVIDs:         3.87 cm      LV e' medial:    4.61 cm/s LV PW:         1.13 cm      LV E/e' medial:  20.1 LV IVS:        1.08 cm      LV e' lateral:   6.19 cm/s LVOT diam:     2.00 cm      LV E/e' lateral: 14.9 LV SV:         68 LV SV Index:   32 LVOT Area:     3.14 cm                              3D Volume EF: LV Volumes (MOD)            3D EF:        65 % LV  vol d, MOD A2C: 95.0 ml  LV EDV:       168 ml LV vol d, MOD A4C: 122.0 ml LV ESV:       59 ml LV vol s, MOD A2C: 37.4 ml  LV SV:        109 ml LV vol s, MOD A4C: 43.0 ml LV SV MOD A2C:     57.6 ml LV SV MOD A4C:     122.0 ml LV SV MOD  BP:      66.9 ml RIGHT VENTRICLE RV S prime:     20.00 cm/s TAPSE (M-mode): 2.8 cm LEFT ATRIUM             Index        RIGHT ATRIUM           Index LA diam:        4.80 cm 2.28 cm/m   RA Area:     20.00 cm LA Vol (A2C):   77.0 ml 36.53 ml/m  RA Volume:   53.70 ml  25.47 ml/m LA Vol (A4C):   94.9 ml 45.02 ml/m LA Biplane Vol: 86.7 ml 41.13 ml/m  AORTIC VALVE                    PULMONIC VALVE AV Area (Vmax):    2.31 cm     PV Vmax:          1.11 m/s AV Area (Vmean):   2.35 cm     PV Peak grad:     4.9 mmHg AV Area (VTI):     2.34 cm     PR End Diast Vel: 3.15 msec AV Vmax:           136.00 cm/s AV Vmean:          89.700 cm/s AV VTI:            0.293 m AV Peak Grad:      7.4 mmHg AV Mean Grad:      4.0 mmHg LVOT Vmax:         99.80 cm/s LVOT Vmean:        67.200 cm/s LVOT VTI:          0.218 m LVOT/AV VTI ratio: 0.74  AORTA Ao Root diam: 3.90 cm Ao Asc diam:  3.90 cm MITRAL VALVE                TRICUSPID VALVE MV Area (PHT): 3.42 cm     TR Peak grad:   17.8 mmHg MV Decel Time: 222 msec     TR Vmax:        211.00 cm/s MV E velocity: 92.50 cm/s MV A velocity: 106.00 cm/s  SHUNTS MV E/A ratio:  0.87         Systemic VTI:  0.22 m                             Systemic Diam: 2.00 cm Rachelle Hora Croitoru MD Electronically signed by Thurmon Fair MD Signature Date/Time: 04/19/2023/11:14:11 AM    Final     Assessment/Plan 1. Cough, unspecified type  - POC COVID-19 was negative - DG Chest 2 View; Future  2. Acute viral conjunctivitis of both eyes Ocuflox for 2 weeks  3. Obstructive sleep apnea syndrome Using CPAP with 3 l of oxygen  4. Primary hypertension Amlodipine and hydrochlorothiazide ,Cozaar  5. Type 2 diabetes mellitus with hyperglycemia, without long-term current use  of insulin (HCC) Metformin  6. Chronic obstructive pulmonary disease, unspecified COPD type (HCC) On Breo  Will call him after chest xray  Labs/tests ordered:   Next appt:  06/14/2023

## 2023-05-05 ENCOUNTER — Other Ambulatory Visit: Payer: Self-pay | Admitting: Internal Medicine

## 2023-05-25 ENCOUNTER — Telehealth: Payer: Medicare Other | Admitting: Internal Medicine

## 2023-05-25 DIAGNOSIS — H9190 Unspecified hearing loss, unspecified ear: Secondary | ICD-10-CM

## 2023-05-25 NOTE — Telephone Encounter (Signed)
Patient has an appointment at [facility] on 07/13/23, but needs a referral sent to [facility] so that Medicare will cover it. Please send referral to [facility] for hearing test.

## 2023-05-26 NOTE — Addendum Note (Signed)
Addended by: Cephus Richer on: 05/26/2023 01:10 PM   Modules accepted: Orders

## 2023-06-07 ENCOUNTER — Encounter (HOSPITAL_BASED_OUTPATIENT_CLINIC_OR_DEPARTMENT_OTHER): Payer: Self-pay | Admitting: Pulmonary Disease

## 2023-06-07 ENCOUNTER — Ambulatory Visit (INDEPENDENT_AMBULATORY_CARE_PROVIDER_SITE_OTHER): Payer: Medicare Other | Admitting: Pulmonary Disease

## 2023-06-07 VITALS — BP 112/80 | HR 89 | Resp 16 | Ht 68.0 in | Wt 215.2 lb

## 2023-06-07 DIAGNOSIS — J4489 Other specified chronic obstructive pulmonary disease: Secondary | ICD-10-CM

## 2023-06-07 DIAGNOSIS — G4733 Obstructive sleep apnea (adult) (pediatric): Secondary | ICD-10-CM

## 2023-06-07 DIAGNOSIS — J9611 Chronic respiratory failure with hypoxia: Secondary | ICD-10-CM

## 2023-06-07 LAB — BASIC METABOLIC PANEL
BUN: 22 — AB (ref 4–21)
CO2: 21 (ref 13–22)
Chloride: 106 (ref 99–108)
Creatinine: 1 (ref 0.6–1.3)
Glucose: 158
Potassium: 4 meq/L (ref 3.5–5.1)
Sodium: 147 (ref 137–147)

## 2023-06-07 LAB — HEPATIC FUNCTION PANEL
ALT: 34 U/L (ref 10–40)
AST: 27 (ref 14–40)
Bilirubin, Total: 0.5

## 2023-06-07 LAB — LIPID PANEL
Cholesterol: 119 (ref 0–200)
HDL: 31 — AB (ref 35–70)
LDL Cholesterol: 30
Triglycerides: 290 — AB (ref 40–160)

## 2023-06-07 LAB — COMPREHENSIVE METABOLIC PANEL
Albumin: 4.3 (ref 3.5–5.0)
Calcium: 9.4 (ref 8.7–10.7)
eGFR: 77

## 2023-06-07 LAB — CBC AND DIFFERENTIAL
HCT: 48 (ref 41–53)
Hemoglobin: 16.4 (ref 13.5–17.5)
Platelets: 165 10*3/uL (ref 150–400)
WBC: 9.2

## 2023-06-07 LAB — CBC: RBC: 5.3 — AB (ref 3.87–5.11)

## 2023-06-07 LAB — HEMOGLOBIN A1C: Hemoglobin A1C: 7.8

## 2023-06-07 NOTE — Patient Instructions (Signed)
Will arrange for a chin strap and change your CPAP setting to 7 cm water pressure.  Will arrange for an overnight oxygen test with CPAP and 3 liters oxygen.  Follow up in 3 months with Dr. Vassie Loll.

## 2023-06-07 NOTE — Progress Notes (Signed)
[facility]  Chief Complaint  Patient presents with   Follow-up    OSA, COPD. Starting with CPAP and not oral device. He is getting used to it.     Constitutional:  BP 112/80   Pulse 89   Resp 16   Ht 5\' 8"  (1.727 m)   Wt 215 lb 3.2 oz (97.6 kg)   SpO2 (!) 89%   BMI 32.72 kg/m   Past Medical History:  HLD, HTN, Pre-DM  Past Surgical History:  He  has a past surgical history that includes Cholecystectomy and Tonsillectomy.  Brief Summary:  Steven Davies is a 82 y.o. male former smoker with COPD and obstructive sleep apnea.      Subjective:   He is here with his wife.    Has nasal pillow mask.  Gets mouth dryness.  Using 3 liters with CPAP.  Has his own oxygen set up he purchased plus what he gets from insurance.  Breathing okay.  Not having cough, wheeze, or sputum.   Physical Exam:   Appearance - well kempt   ENMT - no sinus tenderness, no oral exudate, no LAN, Mallampati 3 airway, no stridor  Respiratory - equal breath sounds bilaterally, no wheezing or rales  CV - s1s2 regular rate and rhythm, no murmurs  Ext - no clubbing, no edema  Skin - no rashes  Psych - normal mood and affect       Pulmonary testing:  PFT 10/13/20 >> FEV1 1.90 (67%), FEV1% 56, TLC 5.87 (87%), DLCO 54% PFT 09/21/22 >> FEV1 1.83 (70%), FEV1% 64, TLC 5.67 (86%), DLCO 64%  Chest Imaging:  CT chest 07/26/18 >> LLL nodule was 14 mm in 2010 and now 2.5 mm, mild centrilobular emphysema CT chest 04/10/21 >> 2.8 x 2.0 x 2.5 cm Lt lung base hamartoma, fatty liver, atherosclerosis  Sleep Tests:  HST 05/06/21 >> AHI 13.4, SpO2 low 75%.  Spent 235.6 min with SpO2 < 89%. ONO with oral appliance 08/11/22 >> test time 6 hrs 39 min. Baseline SpO2 84%, low SpO2 74%. Spent 6 hrs 35 min with an SpO2 < 88%  ONO with 5 liters and oral appliance 12/29/22 >> test time 9 hrs 40 sec.  Baseline SpO2 91%, low SpO2 79%.  Spent 23 min 32 sec with SpO2 <  88%. CPAP 03/05/23 to 06/02/23 >> used on 80 of 90 nights with average 8 hrs 30 min.  Average AHI 0.7 with CPAP 8 cm H2O  Cardiac Tests:  Echo 04/19/23 >> EF 55 to 60%, grade 1 DD, mod LA dilation, mild MR, ascending aorta 39 mm    Social History:  He  reports that he has quit smoking. His smoking use included cigarettes. He has a 88 pack-year smoking history. He has never used smokeless tobacco. He reports current alcohol use of about 3.0 standard drinks of alcohol per week. He reports that he does not use drugs.  Family History:  His family history includes Heart attack in his father; Heart failure in his brother and mother.     Assessment/Plan:   Obstructive sleep apnea with nocturnal hypoxemia. - he is compliant with CPAP and reports benefit from therapy - main issue is mouth dryness - will change to CPAP 7 cm H2O and arrange for a chin strap - will arrange for overnight oximetry with CPAP and 3 liters, and then determine if he needs further adjustment to his set up  COPD with asthma and emphysema. -  continue breo 200 one puff daily - prn albuterol  Obesity. 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 - Hb has been between 17.5 and 18.3 over the past 1 year - likely from hypoxemia  Time Spent Involved in Patient Care on Day of Examination:  26 minutes  Follow up:   Patient Instructions  Will arrange for a chin strap and change your CPAP setting to 7 cm water pressure.  Will arrange for an overnight oxygen test with CPAP and 3 liters oxygen.  Follow up in 3 months with Dr. Vassie Loll.   Medication List:   Allergies as of 06/07/2023       Reactions   Sulfa Antibiotics         Medication List        Accurate as of June 07, 2023  2:45 PM. If you have any questions, ask your nurse or doctor.          STOP taking these medications    ofloxacin 0.3 % ophthalmic solution Commonly known as:  Ocuflox Stopped by: Coralyn Helling   predniSONE 20 MG tablet Commonly known as: DELTASONE Stopped by: Coralyn Helling       TAKE these medications    amLODipine 10 MG tablet Commonly known as: NORVASC TAKE 1 TABLET BY MOUTH EVERY DAY   aspirin EC 81 MG tablet Take 81 mg by mouth daily. Swallow whole.   atorvastatin 10 MG tablet Commonly known as: LIPITOR TAKE 1 TABLET BY MOUTH EVERY DAY   Breo Ellipta 200-25 MCG/ACT Aepb Generic drug: fluticasone furoate-vilanterol INHALE 1 PUFF BY MOUTH EVERY DAY   finasteride 5 MG tablet Commonly known as: PROSCAR TAKE 1 TABLET BY MOUTH EVERY OTHER DAY   hydrochlorothiazide 12.5 MG capsule Commonly known as: MICROZIDE Take 1 capsule (12.5 mg total) by mouth every other day.   losartan 100 MG tablet Commonly known as: COZAAR TAKE 1 TABLET BY MOUTH EVERY DAY   metFORMIN 500 MG tablet Commonly known as: GLUCOPHAGE TAKE 1 TABLET BY MOUTH 2 TIMES DAILY WITH A MEAL.   OXYGEN Inhale 3 L into the lungs at bedtime.   tamsulosin 0.4 MG Caps capsule Commonly known as: FLOMAX TAKE 1 CAPSULE BY MOUTH EVERY DAY        Signature:  Coralyn Helling, MD [facility] Pulmonary/Critical Care Pager - [phone] 06/07/2023, 2:45 PM

## 2023-06-14 ENCOUNTER — Non-Acute Institutional Stay: Payer: Medicare Other | Admitting: Internal Medicine

## 2023-06-14 ENCOUNTER — Encounter: Payer: Self-pay | Admitting: Internal Medicine

## 2023-06-14 VITALS — BP 130/82 | HR 85 | Temp 98.0°F | Resp 17 | Ht 68.0 in | Wt 215.2 lb

## 2023-06-14 DIAGNOSIS — R600 Localized edema: Secondary | ICD-10-CM | POA: Diagnosis not present

## 2023-06-14 DIAGNOSIS — E1165 Type 2 diabetes mellitus with hyperglycemia: Secondary | ICD-10-CM

## 2023-06-14 DIAGNOSIS — I10 Essential (primary) hypertension: Secondary | ICD-10-CM | POA: Diagnosis not present

## 2023-06-14 DIAGNOSIS — N401 Enlarged prostate with lower urinary tract symptoms: Secondary | ICD-10-CM

## 2023-06-14 DIAGNOSIS — M5441 Lumbago with sciatica, right side: Secondary | ICD-10-CM

## 2023-06-14 DIAGNOSIS — M5442 Lumbago with sciatica, left side: Secondary | ICD-10-CM

## 2023-06-14 DIAGNOSIS — G8929 Other chronic pain: Secondary | ICD-10-CM

## 2023-06-14 DIAGNOSIS — J449 Chronic obstructive pulmonary disease, unspecified: Secondary | ICD-10-CM | POA: Diagnosis not present

## 2023-06-14 DIAGNOSIS — E7800 Pure hypercholesterolemia, unspecified: Secondary | ICD-10-CM

## 2023-06-14 DIAGNOSIS — R911 Solitary pulmonary nodule: Secondary | ICD-10-CM

## 2023-06-14 NOTE — Patient Instructions (Signed)
Can take extra hydrochlorothiazide if notice swelling in the legs Can hold statin for 1 week to see if it helps with the leg Pain

## 2023-06-14 NOTE — Progress Notes (Signed)
Location:  [facility] features editor of Service:  Clinic (12)  Provider:   Code Status:  Goals of Care:     06/14/2023   10:09 AM  Advanced Directives  Does Patient Have a Medical Advance Directive? Yes  Type of Estate agent of [place];Out of facility DNR (pink MOST or yellow form);Living will  Does patient want to make changes to medical advance directive? No - Patient declined  Copy of Healthcare Power of Attorney in Chart? No - copy requested     Chief Complaint  Patient presents with   Medical Management of Chronic Issues    Patient is being seen for a 4 month follow up and discuss labs and back pain    Immunizations    Patient is due for flu vaccine. Patient is scheduled Already   Health Maintenance    Patient is due for a foot exam , diabetic kidney evaluation    HPI: Patient is a 82 y.o. male seen today for medical management of chronic diseases.   [place] in [place] in [place] with [place]  Sleep Apnea  Using CPAP  Had follow up with Pulmonary Lung Nodule Needs repeat CT scan Diabetes Type 2 Has been Non Compliant with Metformin Also with his diet A1C is elevated Also was recently on Prednisone Not very active  Back Pain Takes Advil 2 every day helping Has seen Dr Shelle Iron  Next step MRI but waiting for his Apnea issue resolved first   Edema in his legs Off and on Can be due to Norvasc He did not have any today  Other issues   History of complicated renal cyst. 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 Swallow whole.   atorvastatin (LIPITOR) 10 MG tablet TAKE 1 TABLET BY MOUTH EVERY DAY   BREO ELLIPTA 200-25 MCG/ACT AEPB INHALE 1 PUFF BY MOUTH EVERY DAY   finasteride (PROSCAR) 5 MG tablet TAKE 1 TABLET BY MOUTH EVERY OTHER DAY   hydrochlorothiazide (MICROZIDE) 12.5 MG capsule Take 1 capsule (12.5 mg total) by mouth every other day.   losartan (COZAAR) 100 MG tablet TAKE 1 TABLET BY MOUTH EVERY DAY   metFORMIN (GLUCOPHAGE) 500 MG tablet TAKE 1 TABLET BY MOUTH 2 TIMES DAILY WITH A MEAL.   OXYGEN Inhale 3 L into the lungs at bedtime.   tamsulosin (FLOMAX) 0.4 MG CAPS capsule TAKE 1 CAPSULE BY MOUTH EVERY DAY   No facility-administered encounter medications on file as of 06/14/2023.    Review of Systems:  Review of Systems  Constitutional:  Negative for activity change, appetite change and unexpected weight change.  HENT: Negative.    Respiratory:  Negative for cough and shortness of breath.   Cardiovascular:  Positive for leg swelling.  Gastrointestinal:  Negative for constipation.  Genitourinary:  Negative for frequency.  Musculoskeletal:  Negative for arthralgias, gait problem and myalgias.  Skin: Negative.  Negative for rash.  Neurological:  Negative for dizziness and weakness.  Psychiatric/Behavioral:  Negative for confusion and sleep disturbance.   All other systems reviewed and are negative.   Health Maintenance  Topic Date Due   Medicare Annual Wellness (AWV)  Never done   Diabetic kidney evaluation - Urine ACR  Never done   Hepatitis C Screening  Never done   FOOT EXAM  01/28/2023    INFLUENZA VACCINE  05/05/2023   COVID-19 Vaccine (8 - 2023-24 season) 06/30/2023 (Originally 06/05/2023)   HEMOGLOBIN A1C  12/05/2023   OPHTHALMOLOGY EXAM  02/26/2024   Diabetic kidney evaluation - eGFR measurement  06/06/2024   Pneumonia Vaccine 10+ Years old  Completed   Zoster Vaccines- Shingrix  Completed   HPV VACCINES  Aged Out   DTaP/Tdap/Td  Discontinued    Physical Exam: Vitals:   06/14/23 1007  BP: 130/82  Pulse: 85  Resp: 17  Temp: 98 F (36.7 C)  TempSrc: Temporal  SpO2: 100%  Weight: 215 lb 3.2 oz (97.6 kg)  Height: 5\' 8"  (1.727 m)   Body mass index is 32.72 kg/m. Physical Exam Vitals reviewed.  Constitutional:      Appearance: Normal appearance.  HENT:     Head: Normocephalic.     Nose: Nose normal.     Mouth/Throat:     Mouth: Mucous membranes are moist.     Pharynx: Oropharynx is clear.  Eyes:     Pupils: Pupils are equal, round, and reactive to light.  Cardiovascular:     Rate and Rhythm: Normal rate and regular rhythm.     Pulses: Normal pulses.     Heart sounds: No murmur heard. Pulmonary:     Effort: Pulmonary effort is normal. No respiratory distress.     Breath sounds: Normal breath sounds. No rales.  Abdominal:     General: Abdomen is flat. Bowel sounds are normal.     Palpations: Abdomen is soft.  Musculoskeletal:        General: No swelling.     Cervical back: Neck supple.  Skin:    General: Skin is warm.  Neurological:     General: No focal deficit present.     Mental Status: He is alert and oriented to person, place, and time.  Psychiatric:        Mood and Affect: Mood normal.        Thought Content: Thought content normal.     Labs reviewed: Basic Metabolic Panel: Recent Labs    10/12/22 0000 01/27/23 0000 06/07/23 0000  NA 146 146 147  K 4.1 4.0 4.0  CL 108 108 106  CO2 26* 26* 21  BUN 18 20 22*  CREATININE 1.3 0.9 1.0  CALCIUM 9.6 9.5 9.4   Liver Function Tests: Recent Labs    10/12/22 0000 01/27/23 0000  06/07/23 0000  AST 29 25 27   ALT 31 33 34  ALKPHOS 110 121  --   ALBUMIN 4.1 4.3 4.3   No results for input(s): "LIPASE", "AMYLASE" in the last 8760 hours. No results for input(s): "AMMONIA" in the last 8760 hours. CBC: Recent Labs  10/12/22 0000 01/27/23 0000 06/07/23 0000  WBC 7.5 8.6 9.2  HGB 17.5 17.2 16.4  HCT 52 50 48  PLT 149* 153 165   Lipid Panel: Recent Labs    01/27/23 0000 06/07/23 0000  CHOL 128 119  HDL 31* 31*  LDLCALC 27 30  TRIG 347* 290*   Lab Results  Component Value Date   HGBA1C 7.8 06/07/2023    Procedures since last visit: No results found.  Assessment/Plan 1. Type 2 diabetes mellitus with hyperglycemia, without long-term current use of insulin (HCC) Discussed different options He wants to think about GLP 1 Agonist for weight loss and Diabetes Repeat A1C in 3 months 2. Primary hypertension On Norvasc,Cozaar and HCTZ  3. Bilateral leg edema He had no edema today It can be due to Norvasc He can take hydrochlorothiazide extra dose when he has edema or increase it to every day Can also consider changing dose of Norvasc  4. Chronic obstructive pulmonary disease, unspecified COPD type (HCC) Breo  5. Chronic bilateral low back pain without sciatica Takes Advil as needs Side effects discussed Is going to follow Dr Shelle Iron  6. Pure hypercholesterolemia On statin Was c/o Some pain in legs Most likely due to his back issue but can hold statin for few days to see if it helps He does need statin very low HDL high risk for CAD  7. Benign prostatic hyperplasia with lower urinary tract symptoms, symptom details unspecified On Proscar and Flomax  8. Lung nodule  - CT Chest Wo Contrast; Future 9 Erythrocytosis Resolved now 10 Elevated Sodium Increase fluid   Labs/tests ordered:  A1 and BMP in 3 months Next appt:  Visit date not found

## 2023-06-18 ENCOUNTER — Ambulatory Visit (HOSPITAL_BASED_OUTPATIENT_CLINIC_OR_DEPARTMENT_OTHER)
Admission: RE | Admit: 2023-06-18 | Discharge: 2023-06-18 | Disposition: A | Discharge status: Home / Self Care | Payer: Medicare Other | Source: Ambulatory Visit | Attending: Internal Medicine | Admitting: Internal Medicine

## 2023-06-18 DIAGNOSIS — R911 Solitary pulmonary nodule: Secondary | ICD-10-CM

## 2023-07-01 ENCOUNTER — Telehealth: Payer: Self-pay | Admitting: Pulmonary Disease

## 2023-07-01 NOTE — Telephone Encounter (Signed)
Patient would like results of ONO. Also checking on chin strap for CPAP machine. Patient phone number is [phone].

## 2023-07-05 NOTE — Telephone Encounter (Signed)
New, [place], Sara Chu; Darcel Smalling Received, thank you!       Previous Messages    ----- Message ----- From: Darius Bump Sent: 07/05/2023   9:18 AM EDT To: Kathyrn Sheriff; Santina Evans; Kathe Becton; * Subject: Cherlynn Polo                                    01-10-1941  Any word on this patients chin strap? Thanks   Order placed please advise. Thanks

## 2023-07-05 NOTE — Telephone Encounter (Signed)
Sent message to [employer]

## 2023-07-15 NOTE — Telephone Encounter (Signed)
F/u   Patient verbalized have not heard from the office regarding his messages on 07/01/23.   Asking for a call back to discuss.

## 2023-07-15 NOTE — Telephone Encounter (Signed)
Pt states he needs a ONO copy, we need to call [employer] to obtain this ono

## 2023-07-21 ENCOUNTER — Telehealth: Payer: Self-pay | Admitting: Pulmonary Disease

## 2023-07-21 NOTE — Telephone Encounter (Addendum)
Pt calling in bc he has requested a copy of ONO and still has not rcvd anything. Pt has been calling since 07/01/2023

## 2023-07-21 NOTE — Telephone Encounter (Signed)
Steven Davies results mailed nfn

## 2023-08-14 ENCOUNTER — Other Ambulatory Visit: Payer: Self-pay | Admitting: Internal Medicine

## 2023-09-05 ENCOUNTER — Other Ambulatory Visit: Payer: Self-pay | Admitting: Internal Medicine

## 2023-09-12 ENCOUNTER — Encounter (HOSPITAL_BASED_OUTPATIENT_CLINIC_OR_DEPARTMENT_OTHER): Payer: Self-pay | Admitting: Pulmonary Disease

## 2023-09-12 ENCOUNTER — Ambulatory Visit (HOSPITAL_BASED_OUTPATIENT_CLINIC_OR_DEPARTMENT_OTHER): Payer: Medicare Other | Admitting: Pulmonary Disease

## 2023-09-12 VITALS — BP 118/68 | HR 90 | Resp 18 | Ht 68.0 in | Wt 217.8 lb

## 2023-09-12 DIAGNOSIS — J9611 Chronic respiratory failure with hypoxia: Secondary | ICD-10-CM | POA: Diagnosis not present

## 2023-09-12 DIAGNOSIS — G4733 Obstructive sleep apnea (adult) (pediatric): Secondary | ICD-10-CM | POA: Diagnosis not present

## 2023-09-12 DIAGNOSIS — J4489 Other specified chronic obstructive pulmonary disease: Secondary | ICD-10-CM

## 2023-09-12 DIAGNOSIS — J439 Emphysema, unspecified: Secondary | ICD-10-CM | POA: Diagnosis not present

## 2023-09-12 MED ORDER — TRELEGY ELLIPTA 200-62.5-25 MCG/ACT IN AEPB: 1.0000 | INHALATION_SPRAY | Freq: Every day | RESPIRATORY_TRACT | Status: DC

## 2023-09-12 NOTE — Patient Instructions (Signed)
  x trial of Trelegy 200 -1 puff daily, call for prescription if this works better than Breo  x enrolled in pulmonary rehab program.  x schedule spirometry pre-/post  Obtain chinstrap from DME Continue 4 L blended in your CPAP machine

## 2023-09-12 NOTE — Progress Notes (Signed)
Subjective:    Patient ID: Steven Davies, male    DOB: [birth year], 82 y.o.   MRN: [id number]  HPI  82 y.o. male former smoker with COPD and obstructive sleep apnea, nocturnal hypoxia Pt of Dr Craige Cotta -on CPAP + 3L  He has a 88 pack-year smoking history.   Chief Complaint  Patient presents with   Establish Care    Previous Dr Craige Cotta Pt. Breathing feelsl ike he lost some capcity as he walks up hills. Increased Oxygen to 4L after last test. But only with sleep. Asking for spirometry.     Discussed the use of AI scribe software for clinical note transcription with the patient, who gave verbal consent to proceed.  History of Present Illness   The patient, with a history of sleep apnea and COPD, presents for follow-up. He has been using CPAP with four liters of oxygen blended in after his last visit. Despite this, he reports increased shortness of breath. He has a history of pulmonary care and has been adjusting the levels of his CPAP machine to improve his overnight blood oxygen and sleep performance. He also reports a history of a benign left lobe hemartoma, which has shown minimal enlargement on a recent CT scan. The patient expresses concern about losing breath capacity, noting increased difficulty walking up stairs, walking up hills, and carrying heavy items. He is currently on Breo for COPD and has noticed a decrease in his breath capacity. He expresses interest in potentially increasing his Breo dosage or exploring other treatment options.     CPAP download was reviewed which shows excellent control of events on 7 cm with mild leak and excellent compliance more than 8 hours every night without a single missed night.   Significant tests/ events reviewed  PFT 10/13/20 >> FEV1 1.90 (67%), FEV1% 56, TLC 5.87 (87%), DLCO 54% PFT 09/21/22 >> FEV1 1.83 (70%), FEV1% 64, TLC 5.67 (86%), DLCO 64%  Chest Imaging:  CT chest 07/26/18 >> LLL nodule was 14 mm in 2010 and now 2.5 mm, mild  centrilobular emphysema CT chest 04/10/21 >> 2.8 x 2.0 x 2.5 cm Lt lung base hamartoma, fatty liver, atherosclerosis CT Chest 06/2023 hamartoma, minimally enlarged from prior exam  Sleep Tests:  HST 05/06/21 >> AHI 13.4, SpO2 low 75%.  Spent 235.6 min with SpO2 < 89%. ONO on CPAP/3 L 06/2023 >> T saturation more than an hour less than 88%, increased to 4 L ONO with oral appliance 08/11/22 >> test time 6 hrs 39 min. Baseline SpO2 84%, low SpO2 74%. Spent 6 hrs 35 min with an SpO2 < 88%  ONO with 5 liters and oral appliance 12/29/22 >> test time 9 hrs 40 sec.  Baseline SpO2 91%, low SpO2 79%.  Spent 23 min 32 sec with SpO2 < 88%.  Review of Systems neg for any significant sore throat, dysphagia, itching, sneezing, nasal congestion or excess/ purulent secretions, fever, chills, sweats, unintended wt loss, pleuritic or exertional cp, hempoptysis, orthopnea pnd or change in chronic leg swelling. Also denies presyncope, palpitations, heartburn, abdominal pain, nausea, vomiting, diarrhea or change in bowel or urinary habits, dysuria,hematuria, rash, arthralgias, visual complaints, headache, numbness weakness or ataxia.     Objective:   Physical Exam  Gen. Pleasant, obese, in no distress ENT - no lesions, no post nasal drip Neck: No JVD, no thyromegaly, no carotid bruits Lungs: no use of accessory muscles, no dullness to percussion, RT basal rales no rhonchi  Cardiovascular: Rhythm regular, heart sounds  normal, no murmurs or gallops, no peripheral edema Musculoskeletal: No deformities, no cyanosis or clubbing , no tremors       Assessment & Plan:    Assessment and Plan    Chronic Obstructive Pulmonary Disease (COPD) COPD with a 40-year smoking history. Reports increased dyspnea and decreased exercise tolerance. Currently on Breo 200/25 mcg. Lung capacity at 56%. Recent CT shows minimal enlargement of a benign left lobe hematoma, stable over 15 years. No current exacerbation but at risk for  respiratory infections. Discussed stepping up to Trelegy, a triple therapy inhaler, for symptom improvement. Explained Trelegy combines three medications for better COPD management. Discussed benefits of pulmonary rehabilitation for exercise tolerance and lung function. Emphasized early intervention with antibiotics or steroids for respiratory infection symptoms. - Provide Trelegy sample for trial - Enroll in pulmonary rehabilitation - Order PFT - Advise to call if experiencing symptoms of respiratory infection (e.g., yellow/green phlegm, wheezing) for potential antibiotics or steroids   Chronic resp failure with hypoxia  - Monitor oxygen levels and adjust as needed, ONO reviewed -ct 4L blended during sleep, prn daytime he has his own POC for travel  Obstructive Sleep Apnea Obstructive sleep apnea managed with CPAP therapy at 4 L/min O2 after desaturation on 3 L/min. Reports good compliance and significant improvement in sleep quality. No recent issues with mask fit after strap adjustment. Discussed importance of consistent CPAP use, even during travel, to maintain oxygen levels and prevent desaturation. - Continue CPAP with 4 L/min O2 - Call [employer] to obtain chin strap  General Health Maintenance Up to date on COVID-19, flu, and RSV vaccinations. - Continue current vaccination schedule  Follow-up - Schedule follow-up appointment in three months.

## 2023-09-12 NOTE — Addendum Note (Signed)
Addended by: Jama Flavors on: 09/12/2023 10:57 AM   Modules accepted: Orders

## 2023-09-13 LAB — BASIC METABOLIC PANEL
BUN: 21 (ref 4–21)
CO2: 22 (ref 13–22)
Chloride: 107 (ref 99–108)
Creatinine: 0.9 (ref 0.6–1.3)
Glucose: 116
Potassium: 4.3 meq/L (ref 3.5–5.1)
Sodium: 146 (ref 137–147)

## 2023-09-13 LAB — COMPREHENSIVE METABOLIC PANEL: Calcium: 9.7 (ref 8.7–10.7)

## 2023-09-13 LAB — HEMOGLOBIN A1C: Hemoglobin A1C: 6.8

## 2023-09-15 ENCOUNTER — Telehealth (HOSPITAL_COMMUNITY): Payer: Self-pay

## 2023-09-15 NOTE — Telephone Encounter (Signed)
Called patient to see if he was interested in participating in the Pulmonary Rehab Program. Patient stated yes. Patient will come in for orientation on 10/07/2023 @ 10:30AM and will attend the 1:15PM exercise class.   Pensions consultant.

## 2023-09-20 ENCOUNTER — Non-Acute Institutional Stay: Payer: Medicare Other | Admitting: Internal Medicine

## 2023-09-20 ENCOUNTER — Encounter: Payer: Self-pay | Admitting: Internal Medicine

## 2023-09-20 VITALS — BP 130/88 | HR 92 | Temp 98.0°F | Resp 17 | Ht 68.0 in | Wt 215.8 lb

## 2023-09-20 DIAGNOSIS — E1165 Type 2 diabetes mellitus with hyperglycemia: Secondary | ICD-10-CM

## 2023-09-20 DIAGNOSIS — R600 Localized edema: Secondary | ICD-10-CM

## 2023-09-20 DIAGNOSIS — J449 Chronic obstructive pulmonary disease, unspecified: Secondary | ICD-10-CM | POA: Diagnosis not present

## 2023-09-20 DIAGNOSIS — G8929 Other chronic pain: Secondary | ICD-10-CM

## 2023-09-20 DIAGNOSIS — I10 Essential (primary) hypertension: Secondary | ICD-10-CM

## 2023-09-20 DIAGNOSIS — E7800 Pure hypercholesterolemia, unspecified: Secondary | ICD-10-CM

## 2023-09-20 DIAGNOSIS — M545 Low back pain, unspecified: Secondary | ICD-10-CM

## 2023-09-20 MED ORDER — TRELEGY ELLIPTA 200-62.5-25 MCG/ACT IN AEPB: 1.0000 | INHALATION_SPRAY | Freq: Every day | RESPIRATORY_TRACT | Status: DC

## 2023-09-20 NOTE — Progress Notes (Signed)
Location:  [facility] features editor of Service:  Clinic (12)  Provider:   Code Status:  Goals of Care:     09/20/2023   10:22 AM  Advanced Directives  Does Patient Have a Medical Advance Directive? Yes  Type of Estate agent of [place];Living will;Out of facility DNR (pink MOST or yellow form)  Does patient want to make changes to medical advance directive? Yes (MAU/Ambulatory/Procedural Areas - Information given)  Copy of Healthcare Power of Attorney in Chart? No - copy requested     Chief Complaint  Patient presents with   Medical Management of Chronic Issues    Patient is being seen for a 3 month follow up . Patient has been having leg cramping and swelling    Health Maintenance    Patient is due for Foot exam , diabetic kidney evaluation and AWV (scheduled)    HPI: Patient is a 82 y.o. male seen today for medical management of chronic diseases.   [place]   Sleep Apnea  Using CPAP  Doing well there Lung Nodule Stable on CT Does not need follow up anymore  Diabetes Type 2 Has been Compliant with Metformin Also with his diet ? Loose Stools but overall lost some weight A1C in Good Limits now  Back Pain Takes Advil 2 every day helping Has seen Dr Shelle Iron  Next step MRI    Edema in his legs Becoming a issue now especially since he cut back his HCTZ Also having Cramps at night in both Legs   History of complicated renal cyst. 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   atorvastatin (LIPITOR) 10 MG tablet TAKE 1 TABLET BY MOUTH EVERY DAY   finasteride (PROSCAR) 5 MG tablet TAKE 1 TABLET BY MOUTH EVERY OTHER DAY   hydrochlorothiazide (MICROZIDE) 12.5 MG capsule Take 12.5 mg by mouth daily.   ibuprofen (ADVIL) 200 MG tablet Take 400 mg by mouth every 6 (six) hours as needed for mild pain (pain score 1-3).   losartan (COZAAR) 100 MG tablet TAKE 1 TABLET BY MOUTH EVERY DAY   metFORMIN (GLUCOPHAGE) 500 MG tablet TAKE 1 TABLET BY MOUTH 2 TIMES DAILY WITH A MEAL.   OXYGEN Inhale 4 L into the lungs at bedtime.   tamsulosin (FLOMAX) 0.4 MG CAPS capsule TAKE 1 CAPSULE BY MOUTH EVERY DAY   [DISCONTINUED] amLODipine (NORVASC) 10 MG tablet TAKE 1 TABLET BY MOUTH EVERY DAY (Patient taking differently: Take 5 mg by mouth daily.)   [DISCONTINUED] Fluticasone-Umeclidin-Vilant (TRELEGY ELLIPTA) 200-62.5-25 MCG/ACT AEPB Inhale 1 puff into the lungs daily.   [DISCONTINUED] hydrochlorothiazide (MICROZIDE) 12.5 MG capsule Take  1 capsule (12.5 mg total) by mouth every other day. (Patient taking differently: Take 12.5 mg by mouth daily.)   BREO ELLIPTA 200-25 MCG/ACT AEPB INHALE 1 PUFF BY MOUTH EVERY DAY (Patient not taking: Reported on 09/20/2023)   Fluticasone-Umeclidin-Vilant (TRELEGY ELLIPTA) 200-62.5-25 MCG/ACT AEPB Inhale 1 puff into the lungs daily.   No facility-administered encounter medications on file as of 09/20/2023.    Review of Systems:  Review of Systems  Constitutional:  Negative for activity change, appetite change and unexpected weight change.  HENT: Negative.    Respiratory:  Negative for cough and shortness of breath.   Cardiovascular:  Positive for leg swelling.   Gastrointestinal:  Negative for constipation.  Genitourinary:  Negative for frequency.  Musculoskeletal:  Positive for arthralgias and back pain. Negative for gait problem and myalgias.  Skin: Negative.  Negative for rash.  Neurological:  Negative for dizziness and weakness.  Psychiatric/Behavioral:  Negative for confusion and sleep disturbance.   All other systems reviewed and are negative.   Health Maintenance  Topic Date Due   Medicare Annual Wellness (AWV)  Never done   Diabetic kidney evaluation - Urine ACR  Never done   FOOT EXAM  01/28/2023   OPHTHALMOLOGY EXAM  02/26/2024   HEMOGLOBIN A1C  03/13/2024   Diabetic kidney evaluation - eGFR measurement  09/12/2024   Pneumonia Vaccine 20+ Years old  Completed   INFLUENZA VACCINE  Completed   COVID-19 Vaccine  Completed   Zoster Vaccines- Shingrix  Completed   HPV VACCINES  Aged Out   DTaP/Tdap/Td  Discontinued    Physical Exam: Vitals:   09/20/23 1020  BP: 130/88  Pulse: 92  Resp: 17  Temp: 98 F (36.7 C)  TempSrc: Temporal  SpO2: 91%  Weight: 215 lb 12.8 oz (97.9 kg)  Height: 5\' 8"  (1.727 m)   Body mass index is 32.81 kg/m. Physical Exam Vitals reviewed.  Constitutional:      Appearance: Normal appearance.  HENT:     Head: Normocephalic.     Nose: Nose normal.     Mouth/Throat:     Mouth: Mucous membranes are moist.     Pharynx: Oropharynx is clear.  Eyes:     Pupils: Pupils are equal, round, and reactive to light.  Cardiovascular:     Rate and Rhythm: Normal rate and regular rhythm.     Pulses: Normal pulses.     Heart sounds: No murmur heard. Pulmonary:     Effort: Pulmonary effort is normal. No respiratory distress.     Breath sounds: Normal breath sounds. No rales.  Abdominal:     General: Abdomen is flat. Bowel sounds are normal.     Palpations: Abdomen is soft.  Musculoskeletal:     Cervical back: Neck supple.     Comments: Mild Swelling in both  Legs  Skin:    General: Skin is warm.   Neurological:     General: No focal deficit present.     Mental Status: He is alert and oriented to person, place, and time.  Psychiatric:        Mood and Affect: Mood normal.        Thought Content: Thought content normal.     Labs reviewed: Basic Metabolic Panel: Recent Labs    01/27/23 0000 06/07/23 0000 09/13/23 0000  NA 146 147 146  K 4.0 4.0 4.3  CL 108 106 107  CO2 26* 21 22  BUN 20 22* 21  CREATININE 0.9 1.0 0.9  CALCIUM 9.5  9.4 9.7   Liver Function Tests: Recent Labs    10/12/22 0000 01/27/23 0000 06/07/23 0000  AST 29 25 27   ALT 31 33 34  ALKPHOS 110 121  --   ALBUMIN 4.1 4.3 4.3   No results for input(s): "LIPASE", "AMYLASE" in the last 8760 hours. No results for input(s): "AMMONIA" in the last 8760 hours. CBC: Recent Labs    10/12/22 0000 01/27/23 0000 06/07/23 0000  WBC 7.5 8.6 9.2  HGB 17.5 17.2 16.4  HCT 52 50 48  PLT 149* 153 165   Lipid Panel: Recent Labs    01/27/23 0000 06/07/23 0000  CHOL 128 119  HDL 31* 31*  LDLCALC 27 30  TRIG 347* 290*   Lab Results  Component Value Date   HGBA1C 6.8 09/13/2023    Procedures since last visit: No results found.  Assessment/Plan 1. Type 2 diabetes mellitus with hyperglycemia, without long-term current use of insulin (HCC) (Primary) A1C now in good range Continue Metformin and Diet Eye Exam Annually  2. Primary hypertension Change Norvasc to 5 mg and hydrochlorothiazide to every day to help with Swelling Continue to monitor the BP Goal of Less then 140/90  3. Bilateral leg edema Change hydrochlorothiazide to every day And Change Norvasc to 5 mg  4. Chronic obstructive pulmonary disease, unspecified COPD type (HCC) Trelegy is helping  Will renew  in the prescription  5. Chronic bilateral low back pain without sciatica Taking Advil Discussed side effects Is going to follow with Ortho   6. Pure hypercholesterolemia Statin 7 Benign prostatic hyperplasia with lower urinary  tract symptoms, symptom details unspecified On Proscar and Flomax   8 Erythrocytosis Resolved now 10 Elevated Sodium Increase Free water   11 OSA CPAP with 4 l of Oxygen   Labs/tests ordered:  CBC,CMP,A1C before next visit Next appt:  11/14/2023

## 2023-09-20 NOTE — Patient Instructions (Addendum)
Try OTC Eye drops If symptoms persists call your Eye doctor I have change your BP meds Check Your BP should be below 140/90 Try Voltaren Cream OTC to apply on the back fo rpain

## 2023-09-21 ENCOUNTER — Telehealth: Payer: Self-pay

## 2023-09-21 MED ORDER — TRELEGY ELLIPTA 200-62.5-25 MCG/ACT IN AEPB: 1.0000 | INHALATION_SPRAY | Freq: Every day | RESPIRATORY_TRACT | 3 refills | Status: DC

## 2023-09-21 NOTE — Telephone Encounter (Signed)
Patients spouse called [facility] and they never received new rx for Trelegy. RX pended for review for provider to indicate dispense amount, as this is a new rx

## 2023-09-26 ENCOUNTER — Encounter: Payer: Self-pay | Admitting: Pulmonary Disease

## 2023-09-30 ENCOUNTER — Telehealth (HOSPITAL_COMMUNITY): Payer: Self-pay

## 2023-09-30 NOTE — Telephone Encounter (Signed)
Pt stated he wanted to cancel his pulmonary rehab until the spring and that he would call when he is ready to schedule.   Closed referral.

## 2023-10-07 ENCOUNTER — Ambulatory Visit (HOSPITAL_COMMUNITY): Payer: Medicare Other

## 2023-10-10 ENCOUNTER — Encounter: Payer: Self-pay | Admitting: Ophthalmology

## 2023-10-10 DIAGNOSIS — H34231 Retinal artery branch occlusion, right eye: Secondary | ICD-10-CM

## 2023-10-11 ENCOUNTER — Ambulatory Visit (HOSPITAL_BASED_OUTPATIENT_CLINIC_OR_DEPARTMENT_OTHER)
Admission: RE | Admit: 2023-10-11 | Discharge: 2023-10-11 | Disposition: A | Discharge status: Home / Self Care | Payer: Medicare Other | Source: Ambulatory Visit | Attending: Ophthalmology | Admitting: Ophthalmology

## 2023-10-11 ENCOUNTER — Ambulatory Visit (HOSPITAL_COMMUNITY): Payer: Medicare Other

## 2023-10-11 DIAGNOSIS — H34231 Retinal artery branch occlusion, right eye: Secondary | ICD-10-CM | POA: Insufficient documentation

## 2023-10-13 ENCOUNTER — Ambulatory Visit (HOSPITAL_COMMUNITY): Payer: Medicare Other

## 2023-10-18 ENCOUNTER — Ambulatory Visit (HOSPITAL_COMMUNITY): Payer: Medicare Other

## 2023-10-20 ENCOUNTER — Ambulatory Visit (HOSPITAL_COMMUNITY): Payer: Medicare Other

## 2023-10-25 ENCOUNTER — Ambulatory Visit (HOSPITAL_COMMUNITY): Payer: Medicare Other

## 2023-10-26 ENCOUNTER — Other Ambulatory Visit: Payer: Self-pay | Admitting: Internal Medicine

## 2023-10-27 ENCOUNTER — Ambulatory Visit (HOSPITAL_COMMUNITY): Payer: Medicare Other

## 2023-10-31 ENCOUNTER — Other Ambulatory Visit: Payer: Self-pay | Admitting: *Deleted

## 2023-10-31 MED ORDER — HYDROCHLOROTHIAZIDE 12.5 MG PO CAPS: 12.5000 mg | ORAL_CAPSULE | Freq: Every day | ORAL | 1 refills | Status: DC

## 2023-10-31 MED ORDER — AMLODIPINE BESYLATE 5 MG PO TABS: 5.0000 mg | ORAL_TABLET | Freq: Every day | ORAL | 1 refills | Status: DC

## 2023-10-31 NOTE — Telephone Encounter (Signed)
Patient called and stated that he needs his Amlodipine and hydrochlorothiazide sent to the pharmacy.   Stated that the Amlodipine was changed to 5mg  daily instead of 10mg .   Medication list updated per last OV Note:  2. Primary hypertension Change Norvasc to 5 mg and hydrochlorothiazide to every day to help with Swelling Continue to monitor the BP Goal of Less then 140/90

## 2023-11-01 ENCOUNTER — Ambulatory Visit (HOSPITAL_COMMUNITY): Payer: Medicare Other

## 2023-11-03 ENCOUNTER — Ambulatory Visit (HOSPITAL_COMMUNITY): Payer: Medicare Other

## 2023-11-08 ENCOUNTER — Ambulatory Visit (HOSPITAL_COMMUNITY): Payer: Medicare Other

## 2023-11-10 ENCOUNTER — Ambulatory Visit (HOSPITAL_COMMUNITY): Payer: Medicare Other

## 2023-11-14 ENCOUNTER — Non-Acute Institutional Stay: Payer: Medicare Other | Admitting: Adult Health

## 2023-11-14 ENCOUNTER — Encounter: Payer: Self-pay | Admitting: Adult Health

## 2023-11-14 VITALS — HR 85 | Temp 97.6°F | Resp 18 | Ht 68.0 in | Wt 215.0 lb

## 2023-11-14 DIAGNOSIS — Z0000 Encounter for general adult medical examination without abnormal findings: Secondary | ICD-10-CM | POA: Diagnosis not present

## 2023-11-14 NOTE — Progress Notes (Signed)
 Subjective:   Steven Davies is a 83 y.o. male who presents for Medicare Annual/Subsequent preventive examination at [facility] .  Visit Complete: In person  Patient Medicare AWV questionnaire was completed by the patient on 11/14/23; I have confirmed that all information answered by patient is correct and no changes since this date.  Her reports his back pain is much better. He has seen [facility] and took prednisone         Objective:    Today's Vitals   11/14/23 1315 11/14/23 1327  Pulse: 85   Resp: 18   Temp: 97.6 F (36.4 C)   TempSrc: Temporal   SpO2: 91%   Weight: 215 lb (97.5 kg)   Height: 5\' 8"  (1.727 m)   PainSc:  2    Body mass index is 32.69 kg/m.     11/14/2023    1:23 PM 09/20/2023   10:22 AM 06/14/2023   10:09 AM 05/03/2023    9:29 AM 02/16/2023    8:37 PM 02/01/2023   10:01 AM 01/11/2023    1:03 PM  Advanced Directives  Does Patient Have a Medical Advance Directive? Yes Yes Yes Yes Yes Yes Yes  Type of Estate agent of [place];Living will;Out of facility DNR (pink MOST or yellow form) Healthcare Power of [place];Living will;Out of facility DNR (pink MOST or yellow form) Healthcare Power of [place];Out of facility DNR (pink MOST or yellow form);Living will Healthcare Power of [place];Out of facility DNR (pink MOST or yellow form);Living will Healthcare Power of [place];Living will;Out of facility DNR (pink MOST or yellow form) Healthcare Power of [place];Living will;Out of facility DNR (pink MOST or yellow form) Healthcare Power of [place];Living will;Out of facility DNR (pink MOST or yellow form)  Does patient want to make changes to medical advance directive? No - Patient declined Yes (MAU/Ambulatory/Procedural Areas - Information given) No - Patient declined  No - Patient declined No - Patient declined   Copy of Healthcare Power of Attorney in Chart? No - copy requested No - copy requested No -  copy requested No - copy requested Yes - validated most recent copy scanned in chart (See row information) No - copy requested No - copy requested    Current Medications (verified) Outpatient Encounter Medications as of 11/14/2023  Medication Sig   amLODipine  (NORVASC ) 5 MG tablet Take 1 tablet (5 mg total) by mouth daily.   atorvastatin  (LIPITOR) 10 MG tablet TAKE 1 TABLET BY MOUTH EVERY DAY   finasteride  (PROSCAR ) 5 MG tablet TAKE 1 TABLET BY MOUTH EVERY OTHER DAY   Fluticasone -Umeclidin-Vilant (TRELEGY ELLIPTA ) 200-62.5-25 MCG/ACT AEPB Inhale 1 puff into the lungs daily.   hydrochlorothiazide  (MICROZIDE ) 12.5 MG capsule Take 1 capsule (12.5 mg total) by mouth daily.   ibuprofen (ADVIL) 200 MG tablet Take 400 mg by mouth every 6 (six) hours as needed for mild pain (pain score 1-3).   losartan  (COZAAR ) 100 MG tablet TAKE 1 TABLET BY MOUTH EVERY DAY   metFORMIN  (GLUCOPHAGE ) 500 MG tablet TAKE 1 TABLET BY MOUTH 2 TIMES DAILY WITH A MEAL.   OXYGEN Inhale 4 L into the lungs at bedtime.   tamsulosin  (FLOMAX ) 0.4 MG CAPS capsule TAKE 1 CAPSULE BY MOUTH EVERY DAY   [DISCONTINUED] BREO ELLIPTA  200-25 MCG/ACT AEPB INHALE 1 PUFF BY MOUTH EVERY DAY (Patient not taking: Reported on 11/14/2023)   No facility-administered encounter medications on file as of 11/14/2023.    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   Prediabetes 01/26/2021   Past Surgical History:  Procedure Laterality Date   CHOLECYSTECTOMY     TONSILLECTOMY     Family History  Problem Relation Age of Onset   Heart failure Mother    Heart attack Father    Heart failure Brother    Social History   Socioeconomic History   Marital status: Married    Spouse name: Not on file   Number of children: 2    Years of education: masters degree   Highest education level: Master's degree (e.g., MA, MS, MEng, MEd, MSW, MBA)  Occupational History   Occupation: Art gallery manager    Comment: Masters Degree  Tobacco Use   Smoking status: Former    Current packs/day: 2.00    Average packs/day: 2.0 packs/day for 44.0 years (88.0 ttl pk-yrs)    Types: Cigarettes   Smokeless tobacco: Never  Vaping Use   Vaping status: Never Used  Substance and Sexual Activity   Alcohol use: Yes    Alcohol/week: 3.0 standard drinks of alcohol    Types: 3 Glasses of wine per week   Drug use: Never   Sexual activity: Not on file  Other Topics Concern   Not on file  Social History Narrative   Not on file   Social Drivers of Health   Financial Resource Strain: Not on file  Food Insecurity: Not on file  Transportation Needs: Not on file  Physical Activity: Not on file  Stress: Not on file  Social Connections: Not on file    Tobacco Counseling Counseling given: Not Answered   Clinical Intake:  Pre-visit preparation completed: No  Pain : 0-10 Pain Score: 2  Pain Type: Chronic pain Pain Location: Knee Pain Orientation: Left Pain Descriptors / Indicators: Sharp Pain Onset: In the past 7 days Pain Frequency: Occasional Pain Relieving Factors: advil medication  Pain Relieving Factors: advil medication  BMI - recorded: 32.69 Nutritional Status: BMI > 30  Obese Nutritional Risks: None Diabetes: Yes CBG done?: No Did pt. bring in CBG monitor from home?: No  How often do you need to have someone help you when you read instructions, pamphlets, or other written materials from your doctor or pharmacy?: 1 - Never What is the last grade level you completed in school?: masters degree  Interpreter Needed?: No  Information entered by :: Craige Dixon Donn Wilmot NP   Activities of Daily Living    11/14/2023    1:29 PM  In your present state of health, do you have any difficulty performing the following activities:   Hearing? 1  Vision? 1  Difficulty concentrating or making decisions? 0  Walking or climbing stairs? 0  Dressing or bathing? 0  Doing errands, shopping? 0    Patient Care Team: Marguerite Shiley, MD as PCP - General (Internal Medicine) Sheryle Donning, MD as PCP - Cardiology (Cardiology)  Indicate any recent Medical Services you may have received from other than [facility] providers in the past year (date may be approximate).     Assessment:   This is a routine wellness examination for Steven Davies.  Hearing/Vision screen Hearing Screening - Comments:: Not in the last 12 months Vision Screening - Comments:: Yes in the last 12 months   Goals Addressed  This Visit's Progress    Weight (lb) < 200 lb (90.7 kg)   215 lb (97.5 kg)      Depression Screen    11/14/2023    1:23 PM 09/20/2023   10:22 AM 06/14/2023   10:09 AM 05/03/2023    9:29 AM 02/01/2023   10:01 AM 01/11/2023    1:03 PM 10/19/2022   11:28 AM  PHQ 2/9 Scores  PHQ - 2 Score 0 0 0 0 0 0 0    Fall Risk    11/14/2023    1:23 PM 09/20/2023   10:22 AM 06/14/2023   10:09 AM 05/03/2023    9:28 AM 02/01/2023   10:01 AM  Fall Risk   Falls in the past year? 0 0 0 0 0  Number falls in past yr: 0 0 0 0 0  Injury with Fall? 0 0 0 0 0  Risk for fall due to : No Fall Risks No Fall Risks No Fall Risks No Fall Risks No Fall Risks  Follow up Falls evaluation completed Falls evaluation completed Falls evaluation completed Falls evaluation completed Falls evaluation completed    MEDICARE RISK AT HOME:    TIMED UP AND GO:  Was the test performed?  Yes  Length of time to ambulate 10 feet: 15 sec Gait slow and steady without use of assistive device    Cognitive Function:    11/14/2023    1:30 PM  MMSE - Mini Mental State Exam  Orientation to time 5  Orientation to Place 5  Registration 3  Attention/ Calculation 5  Recall 3  Language- name 2 objects 2  Language- repeat 1  Language- follow 3 step command 3   Language- read & follow direction 1  Write a sentence 1  Copy design 1  Total score 30        Immunizations Immunization History  Administered Date(s) Administered   Fluad Quad(high Dose 65+) 07/06/2022   Fluad Trivalent(High Dose 65+) 07/07/2023   Influenza-Unspecified 07/24/2021   Moderna Covid-19 Fall Seasonal Vaccine 55yrs & older 01/27/2023, 01/27/2023, 07/07/2023   Moderna SARS-COV2 Booster Vaccination 02/12/2021, 07/17/2021   Moderna Sars-Covid-2 Vaccination 11/30/2019, 12/28/2019, 07/30/2020, 08/04/2022   PNEUMOCOCCAL CONJUGATE-20 10/20/2022   Unspecified SARS-COV-2 Vaccination 08/04/2022   Zoster Recombinant(Shingrix ) 11/08/2022, 03/07/2023    TDAP status: Up to date  Flu Vaccine status: Up to date  Pneumococcal vaccine status: Up to date  Covid-19 vaccine status: Completed vaccines  Qualifies for Shingles Vaccine? Yes   Zostavax completed No   Shingrix  Completed?: Yes  Screening Tests Health Maintenance  Topic Date Due   Medicare Annual Wellness (AWV)  Never done   Diabetic kidney evaluation - Urine ACR  Never done   FOOT EXAM  01/28/2023   OPHTHALMOLOGY EXAM  02/26/2024   HEMOGLOBIN A1C  03/13/2024   Diabetic kidney evaluation - eGFR measurement  09/12/2024   Pneumonia Vaccine 60+ Years old  Completed   INFLUENZA VACCINE  Completed   COVID-19 Vaccine  Completed   Zoster Vaccines- Shingrix   Completed   HPV VACCINES  Aged Out   DTaP/Tdap/Td  Discontinued    Health Maintenance  Health Maintenance Due  Topic Date Due   Medicare Annual Wellness (AWV)  Never done   Diabetic kidney evaluation - Urine ACR  Never done   FOOT EXAM  01/28/2023    Colorectal cancer screening: No longer required.   Lung Cancer Screening: (Low Dose CT Chest recommended if Age 88-80 years, 20 pack-year currently smoking OR have  quit w/in 15years.) does not qualify.   Lung Cancer Screening Referral: Already getting CT scan regularly to monitor pulmonary nodule.    Additional Screening:  Hepatitis C Screening: does not qualify; Completed NA  Vision Screening: Recommended annual ophthalmology exams for early detection of glaucoma and other disorders of the eye. Is the patient up to date with their annual eye exam?  Yes  Who is the provider or what is the name of the office in which the patient attends annual eye exams? Seeing [facility]  If pt is not established with a provider, would they like to be referred to a provider to establish care? No .   Dental Screening: Recommended annual dental exams for proper oral hygiene  Diabetic Foot Exam: Diabetic Foot Exam: Overdue, Pt has been advised about the importance in completing this exam. Pt is scheduled for diabetic foot exam on 12/20/23.  Community Resource Referral / Chronic Care Management: CRR required this visit?  No   CCM required this visit?  No     Plan:     I have personally reviewed and noted the following in the patient's chart:   Medical and social history Use of alcohol, tobacco or illicit drugs  Current medications and supplements including opioid prescriptions. Patient is not currently taking opioid prescriptions. Functional ability and status Nutritional status Physical activity Advanced directives List of other physicians Hospitalizations, surgeries, and ER visits in previous 12 months Vitals Screenings to include cognitive, depression, and falls Referrals and appointments  In addition, I have reviewed and discussed with patient certain preventive protocols, quality metrics, and best practice recommendations. A written personalized care plan for preventive services as well as general preventive health recommendations were provided to patient.     Raylene Calamity, NP   11/14/2023   After Visit Summary: given to patient   Nurse Notes: NA         Objective:    Today's Vitals   11/14/23 1315 11/14/23 1327  Pulse: 85   Resp: 18   Temp: 97.6 F (36.4 C)    TempSrc: Temporal   SpO2: 91%   Weight: 215 lb (97.5 kg)   Height: 5\' 8"  (1.727 m)   PainSc:  2    Body mass index is 32.69 kg/m.     11/14/2023    1:23 PM 09/20/2023   10:22 AM 06/14/2023   10:09 AM 05/03/2023    9:29 AM 02/16/2023    8:37 PM 02/01/2023   10:01 AM 01/11/2023    1:03 PM  Advanced Directives  Does Patient Have a Medical Advance Directive? Yes Yes Yes Yes Yes Yes Yes  Type of Estate agent of [place];Living will;Out of facility DNR (pink MOST or yellow form) Healthcare Power of [place];Living will;Out of facility DNR (pink MOST or yellow form) Healthcare Power of [place];Out of facility DNR (pink MOST or yellow form);Living will Healthcare Power of [place];Out of facility DNR (pink MOST or yellow form);Living will Healthcare Power of [place];Living will;Out of facility DNR (pink MOST or yellow form) Healthcare Power of [place];Living will;Out of facility DNR (pink MOST or yellow form) Healthcare Power of [place];Living will;Out of facility DNR (pink MOST or yellow form)  Does patient want to make changes to medical advance directive? No - Patient declined Yes (MAU/Ambulatory/Procedural Areas - Information given) No - Patient declined  No - Patient declined No - Patient declined   Copy of Healthcare Power of Attorney in Chart? No - copy requested No - copy requested No -  copy requested No - copy requested Yes - validated most recent copy scanned in chart (See row information) No - copy requested No - copy requested    Current Medications (verified) Outpatient Encounter Medications as of 11/14/2023  Medication Sig   amLODipine  (NORVASC ) 5 MG tablet Take 1 tablet (5 mg total) by mouth daily.   atorvastatin  (LIPITOR) 10 MG tablet TAKE 1 TABLET BY MOUTH EVERY DAY   finasteride  (PROSCAR ) 5 MG tablet TAKE 1 TABLET BY MOUTH EVERY OTHER DAY   Fluticasone -Umeclidin-Vilant (TRELEGY ELLIPTA ) 200-62.5-25 MCG/ACT AEPB Inhale 1 puff into the lungs daily.    hydrochlorothiazide  (MICROZIDE ) 12.5 MG capsule Take 1 capsule (12.5 mg total) by mouth daily.   ibuprofen (ADVIL) 200 MG tablet Take 400 mg by mouth every 6 (six) hours as needed for mild pain (pain score 1-3).   losartan  (COZAAR ) 100 MG tablet TAKE 1 TABLET BY MOUTH EVERY DAY   metFORMIN  (GLUCOPHAGE ) 500 MG tablet TAKE 1 TABLET BY MOUTH 2 TIMES DAILY WITH A MEAL.   OXYGEN Inhale 4 L into the lungs at bedtime.   tamsulosin  (FLOMAX ) 0.4 MG CAPS capsule TAKE 1 CAPSULE BY MOUTH EVERY DAY   [DISCONTINUED] BREO ELLIPTA  200-25 MCG/ACT AEPB INHALE 1 PUFF BY MOUTH EVERY DAY (Patient not taking: Reported on 11/14/2023)   No facility-administered encounter medications on file as of 11/14/2023.    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   Prediabetes 01/26/2021   Past Surgical History:  Procedure Laterality Date   CHOLECYSTECTOMY     TONSILLECTOMY     Family History  Problem Relation Age of Onset   Heart failure Mother    Heart attack Father    Heart failure Brother    Social History   Socioeconomic History   Marital status: Married    Spouse name: Not on file   Number of children: 2   Years of education: masters degree   Highest education level: Master's degree (e.g., MA, MS, MEng, MEd, MSW, MBA)  Occupational History   Occupation: Art gallery manager    Comment: Masters Degree  Tobacco Use   Smoking status: Former    Current packs/day: 2.00    Average packs/day: 2.0 packs/day for 44.0 years (88.0 ttl pk-yrs)    Types: Cigarettes   Smokeless tobacco: Never  Vaping Use   Vaping status: Never Used  Substance and Sexual Activity   Alcohol use: Yes    Alcohol/week: 3.0 standard drinks of alcohol    Types: 3 Glasses of wine per week   Drug use: Never   Sexual  activity: Not on file  Other Topics Concern   Not on file  Social History Narrative   Not on file   Social Drivers of Health   Financial Resource Strain: Not on file  Food Insecurity: Not on file  Transportation Needs: Not on file  Physical Activity: Not on file  Stress: Not on file  Social Connections: Not on file    Tobacco Counseling Counseling given: Not Answered   Clinical Intake:  Pre-visit preparation completed: No  Pain : 0-10 Pain Score: 2  Pain Type: Chronic pain Pain Location: Knee Pain Orientation: Left Pain Descriptors / Indicators: Sharp Pain Onset: In the past 7 days Pain Frequency: Occasional Pain Relieving Factors: advil  medication  Pain Relieving Factors: advil medication  BMI - recorded: 32.69 Nutritional Status: BMI > 30  Obese Nutritional Risks: None Diabetes: Yes CBG done?: No Did pt. bring in CBG monitor from home?: No  How often do you need to have someone help you when you read instructions, pamphlets, or other written materials from your doctor or pharmacy?: 1 - Never What is the last grade level you completed in school?: masters degree  Interpreter Needed?: No  Information entered by :: Craige Dixon Nivea Wojdyla NP   Activities of Daily Living    11/14/2023    1:29 PM  In your present state of health, do you have any difficulty performing the following activities:  Hearing? 1  Vision? 1  Difficulty concentrating or making decisions? 0  Walking or climbing stairs? 0  Dressing or bathing? 0  Doing errands, shopping? 0    Patient Care Team: Marguerite Shiley, MD as PCP - General (Internal Medicine) Sheryle Donning, MD as PCP - Cardiology (Cardiology)  Indicate any recent Medical Services you may have received from other than [facility] providers in the past year (date may be approximate).     Assessment:   This is a routine wellness examination for Steven Davies.  Hearing/Vision screen Hearing Screening - Comments:: Not in the last 12  months Vision Screening - Comments:: Yes in the last 12 months   Goals Addressed             This Visit's Progress    Weight (lb) < 200 lb (90.7 kg)   215 lb (97.5 kg)     Depression Screen    11/14/2023    1:23 PM 09/20/2023   10:22 AM 06/14/2023   10:09 AM 05/03/2023    9:29 AM 02/01/2023   10:01 AM 01/11/2023    1:03 PM 10/19/2022   11:28 AM  PHQ 2/9 Scores  PHQ - 2 Score 0 0 0 0 0 0 0    Fall Risk    11/14/2023    1:23 PM 09/20/2023   10:22 AM 06/14/2023   10:09 AM 05/03/2023    9:28 AM 02/01/2023   10:01 AM  Fall Risk   Falls in the past year? 0 0 0 0 0  Number falls in past yr: 0 0 0 0 0  Injury with Fall? 0 0 0 0 0  Risk for fall due to : No Fall Risks No Fall Risks No Fall Risks No Fall Risks No Fall Risks  Follow up Falls evaluation completed Falls evaluation completed Falls evaluation completed Falls evaluation completed Falls evaluation completed    MEDICARE RISK AT HOME:    TIMED UP AND GO:  Was the test performed?  Yes  Length of time to ambulate 10 feet: 15 sec Gait slow and steady without use of assistive device    Cognitive Function:    11/14/2023    1:30 PM  MMSE - Mini Mental State Exam  Orientation to time 5  Orientation to Place 5  Registration 3  Attention/ Calculation 5  Recall 3  Language- name 2 objects 2  Language- repeat 1  Language- follow 3 step command 3  Language- read & follow direction 1  Write a sentence 1  Copy design 1  Total score 30        Immunizations Immunization History  Administered Date(s) Administered   Fluad Quad(high Dose 65+) 07/06/2022   Fluad Trivalent(High Dose 65+) 07/07/2023   Influenza-Unspecified 07/24/2021   Moderna Covid-19 Fall Seasonal Vaccine 56yrs &  older 01/27/2023, 01/27/2023, 07/07/2023   Moderna SARS-COV2 Booster Vaccination 02/12/2021, 07/17/2021   Moderna Sars-Covid-2 Vaccination 11/30/2019, 12/28/2019, 07/30/2020, 08/04/2022   PNEUMOCOCCAL CONJUGATE-20 10/20/2022   Unspecified  SARS-COV-2 Vaccination 08/04/2022   Zoster Recombinant(Shingrix ) 11/08/2022, 03/07/2023     Screening Tests Health Maintenance  Topic Date Due   Diabetic kidney evaluation - Urine ACR  Never done   FOOT EXAM  01/28/2023   OPHTHALMOLOGY EXAM  02/26/2024   HEMOGLOBIN A1C  03/13/2024   Diabetic kidney evaluation - eGFR measurement  09/12/2024   Medicare Annual Wellness (AWV)  11/13/2024   Pneumonia Vaccine 79+ Years old  Completed   INFLUENZA VACCINE  Completed   COVID-19 Vaccine  Completed   Zoster Vaccines- Shingrix   Completed   HPV VACCINES  Aged Out   DTaP/Tdap/Td  Discontinued

## 2023-11-14 NOTE — Patient Instructions (Signed)
 Steven Davies , Thank you for taking time to come for your Medicare Wellness Visit. I appreciate your ongoing commitment to your health goals. Please review the following plan we discussed and let me know if I can assist you in the future.   Screening recommendations/referrals: Colonoscopy aged out Recommended yearly ophthalmology/optometry visit for glaucoma screening and checkup Recommended yearly dental visit for hygiene and checkup  Vaccinations: Influenza vaccine due annually in September/October Pneumococcal vaccine up to date Tdap vaccine up to date  Shingles vaccine up to date    Advanced directives: reviewed  Conditions/risks identified: Cardiac risk   Next appointment: 1 year  Preventive Care 54 Years and Older, Male Preventive care refers to lifestyle choices and visits with your health care provider that can promote health and wellness. What does preventive care include? A yearly physical exam. This is also called an annual well check. Dental exams once or twice a year. Routine eye exams. Ask your health care provider how often you should have your eyes checked. Personal lifestyle choices, including: Daily care of your teeth and gums. Regular physical activity. Eating a healthy diet. Avoiding tobacco and drug use. Limiting alcohol use. Practicing safe sex. Taking low doses of aspirin every day. Taking vitamin and mineral supplements as recommended by your health care provider. What happens during an annual well check? The services and screenings done by your health care provider during your annual well check will depend on your age, overall health, lifestyle risk factors, and family history of disease. Counseling  Your health care provider may ask you questions about your: Alcohol use. Tobacco use. Drug use. Emotional well-being. Home and relationship well-being. Sexual activity. Eating habits. History of falls. Memory and ability to understand  (cognition). Work and work Astronomer. Screening  You may have the following tests or measurements: Height, weight, and BMI. Blood pressure. Lipid and cholesterol levels. These may be checked every 5 years, or more frequently if you are over 58 years old. Skin check. Lung cancer screening. You may have this screening every year starting at age 46 if you have a 30-pack-year history of smoking and currently smoke or have quit within the past 15 years. Fecal occult blood test (FOBT) of the stool. You may have this test every year starting at age 17. Flexible sigmoidoscopy or colonoscopy. You may have a sigmoidoscopy every 5 years or a colonoscopy every 10 years starting at age 49. Prostate cancer screening. Recommendations will vary depending on your family history and other risks. Hepatitis C blood test. Hepatitis B blood test. Sexually transmitted disease (STD) testing. Diabetes screening. This is done by checking your blood sugar (glucose) after you have not eaten for a while (fasting). You may have this done every 1-3 years. Abdominal aortic aneurysm (AAA) screening. You may need this if you are a current or former smoker. Osteoporosis. You may be screened starting at age 46 if you are at high risk. Talk with your health care provider about your test results, treatment options, and if necessary, the need for more tests. Vaccines  Your health care provider may recommend certain vaccines, such as: Influenza vaccine. This is recommended every year. Tetanus, diphtheria, and acellular pertussis (Tdap, Td) vaccine. You may need a Td booster every 10 years. Zoster vaccine. You may need this after age 77. Pneumococcal 13-valent conjugate (PCV13) vaccine. One dose is recommended after age 3. Pneumococcal polysaccharide (PPSV23) vaccine. One dose is recommended after age 80. Talk to your health care provider about which screenings and  vaccines you need and how often you need them. This  information is not intended to replace advice given to you by your health care provider. Make sure you discuss any questions you have with your health care provider. Document Released: 10/17/2015 Document Revised: 06/09/2016 Document Reviewed: 07/22/2015 Elsevier Interactive Patient Education  2017 [employer].  Fall Prevention in the Home Falls can cause injuries. They can happen to people of all ages. There are many things you can do to make your home safe and to help prevent falls. What can I do on the outside of my home? Regularly fix the edges of walkways and driveways and fix any cracks. Remove anything that might make you trip as you walk through a door, such as a raised step or threshold. Trim any bushes or trees on the path to your home. Use bright outdoor lighting. Clear any walking paths of anything that might make someone trip, such as rocks or tools. Regularly check to see if handrails are loose or broken. Make sure that both sides of any steps have handrails. Any raised decks and porches should have guardrails on the edges. Have any leaves, snow, or ice cleared regularly. Use sand or salt on walking paths during winter. Clean up any spills in your garage right away. This includes oil or grease spills. What can I do in the bathroom? Use night lights. Install grab bars by the toilet and in the tub and shower. Do not use towel bars as grab bars. Use non-skid mats or decals in the tub or shower. If you need to sit down in the shower, use a plastic, non-slip stool. Keep the floor dry. Clean up any water that spills on the floor as soon as it happens. Remove soap buildup in the tub or shower regularly. Attach bath mats securely with double-sided non-slip rug tape. Do not have throw rugs and other things on the floor that can make you trip. What can I do in the bedroom? Use night lights. Make sure that you have a light by your bed that is easy to reach. Do not use any sheets or  blankets that are too big for your bed. They should not hang down onto the floor. Have a firm chair that has side arms. You can use this for support while you get dressed. Do not have throw rugs and other things on the floor that can make you trip. What can I do in the kitchen? Clean up any spills right away. Avoid walking on wet floors. Keep items that you use a lot in easy-to-reach places. If you need to reach something above you, use a strong step stool that has a grab bar. Keep electrical cords out of the way. Do not use floor polish or wax that makes floors slippery. If you must use wax, use non-skid floor wax. Do not have throw rugs and other things on the floor that can make you trip. What can I do with my stairs? Do not leave any items on the stairs. Make sure that there are handrails on both sides of the stairs and use them. Fix handrails that are broken or loose. Make sure that handrails are as long as the stairways. Check any carpeting to make sure that it is firmly attached to the stairs. Fix any carpet that is loose or worn. Avoid having throw rugs at the top or bottom of the stairs. If you do have throw rugs, attach them to the floor with carpet tape. Make  sure that you have a light switch at the top of the stairs and the bottom of the stairs. If you do not have them, ask someone to add them for you. What else can I do to help prevent falls? Wear shoes that: Do not have high heels. Have rubber bottoms. Are comfortable and fit you well. Are closed at the toe. Do not wear sandals. If you use a stepladder: Make sure that it is fully opened. Do not climb a closed stepladder. Make sure that both sides of the stepladder are locked into place. Ask someone to hold it for you, if possible. Clearly mark and make sure that you can see: Any grab bars or handrails. First and last steps. Where the edge of each step is. Use tools that help you move around (mobility aids) if they are  needed. These include: Canes. Walkers. Scooters. Crutches. Turn on the lights when you go into a dark area. Replace any light bulbs as soon as they burn out. Set up your furniture so you have a clear path. Avoid moving your furniture around. If any of your floors are uneven, fix them. If there are any pets around you, be aware of where they are. Review your medicines with your doctor. Some medicines can make you feel dizzy. This can increase your chance of falling. Ask your doctor what other things that you can do to help prevent falls. This information is not intended to replace advice given to you by your health care provider. Make sure you discuss any questions you have with your health care provider. Document Released: 07/17/2009 Document Revised: 02/26/2016 Document Reviewed: 10/25/2014 Elsevier Interactive Patient Education  2017 [employer].

## 2023-11-15 ENCOUNTER — Ambulatory Visit (HOSPITAL_COMMUNITY): Payer: Medicare Other

## 2023-11-17 ENCOUNTER — Ambulatory Visit (HOSPITAL_COMMUNITY): Payer: Medicare Other

## 2023-11-22 ENCOUNTER — Ambulatory Visit (HOSPITAL_COMMUNITY): Payer: Medicare Other

## 2023-11-24 ENCOUNTER — Ambulatory Visit (HOSPITAL_COMMUNITY): Payer: Medicare Other

## 2023-11-29 ENCOUNTER — Ambulatory Visit (HOSPITAL_COMMUNITY): Payer: Medicare Other

## 2023-11-30 ENCOUNTER — Ambulatory Visit (HOSPITAL_BASED_OUTPATIENT_CLINIC_OR_DEPARTMENT_OTHER): Payer: Medicare Other | Admitting: Pulmonary Disease

## 2023-11-30 DIAGNOSIS — J439 Emphysema, unspecified: Secondary | ICD-10-CM | POA: Diagnosis not present

## 2023-11-30 DIAGNOSIS — J4489 Other specified chronic obstructive pulmonary disease: Secondary | ICD-10-CM

## 2023-11-30 LAB — PULMONARY FUNCTION TEST
FEF 25-75 Post: 1.28 L/s
FEF 25-75 Pre: 0.72 L/s
FEF2575-%Change-Post: 78 %
FEF2575-%Pred-Post: 73 %
FEF2575-%Pred-Pre: 41 %
FEV1-%Change-Post: 14 %
FEV1-%Pred-Post: 72 %
FEV1-%Pred-Pre: 62 %
FEV1-Post: 1.84 L
FEV1-Pre: 1.6 L
FEV1FVC-%Change-Post: 1 %
FEV1FVC-%Pred-Pre: 88 %
FEV6-%Change-Post: 9 %
FEV6-%Pred-Post: 81 %
FEV6-%Pred-Pre: 74 %
FEV6-Post: 2.73 L
FEV6-Pre: 2.49 L
FEV6FVC-%Change-Post: -3 %
FEV6FVC-%Pred-Post: 102 %
FEV6FVC-%Pred-Pre: 105 %
FVC-%Change-Post: 13 %
FVC-%Pred-Post: 79 %
FVC-%Pred-Pre: 70 %
FVC-Post: 2.88 L
FVC-Pre: 2.54 L
Post FEV1/FVC ratio: 64 %
Post FEV6/FVC ratio: 95 %
Pre FEV1/FVC ratio: 63 %
Pre FEV6/FVC Ratio: 98 %

## 2023-11-30 NOTE — Patient Instructions (Signed)
Pre/Post Spirometry performed today.

## 2023-11-30 NOTE — Progress Notes (Signed)
 Pt.notified

## 2023-11-30 NOTE — Progress Notes (Signed)
Pre/Post Spirometry performed today.

## 2023-12-01 ENCOUNTER — Ambulatory Visit (HOSPITAL_COMMUNITY): Payer: Medicare Other

## 2023-12-05 ENCOUNTER — Ambulatory Visit (HOSPITAL_BASED_OUTPATIENT_CLINIC_OR_DEPARTMENT_OTHER): Payer: Medicare Other | Admitting: Pulmonary Disease

## 2023-12-05 ENCOUNTER — Encounter (HOSPITAL_BASED_OUTPATIENT_CLINIC_OR_DEPARTMENT_OTHER): Payer: Self-pay | Admitting: Pulmonary Disease

## 2023-12-05 VITALS — BP 110/74 | HR 102 | Ht 67.5 in | Wt 210.8 lb

## 2023-12-05 DIAGNOSIS — G4733 Obstructive sleep apnea (adult) (pediatric): Secondary | ICD-10-CM

## 2023-12-05 DIAGNOSIS — R911 Solitary pulmonary nodule: Secondary | ICD-10-CM

## 2023-12-05 DIAGNOSIS — J449 Chronic obstructive pulmonary disease, unspecified: Secondary | ICD-10-CM

## 2023-12-05 DIAGNOSIS — J439 Emphysema, unspecified: Secondary | ICD-10-CM

## 2023-12-05 NOTE — Progress Notes (Signed)
 Subjective:    Patient ID: Steven Davies, male    DOB: [birth year], 83 y.o.   MRN: [id number]  HPI  83 y.o. male former smoker with COPD and obstructive sleep apnea, nocturnal hypoxia Pt of Dr Craige Cotta -on CPAP + 3L  He has a 88 pack-year smoking history.   The patient, with a history of COPD, chronic respiratory failure, sleep apnea, and CPAP use, reports improved symptoms with Trelegy. He has been able to increase his physical activity and experience less shortness of breath. He has a surplus of Breo 200 and inquires about alternating between the two medications. He also discusses the cost of his medications and the difference in price between a 30-day and 90-day supply.  The patient's CPAP is working well, with oxygen set at 4 liters. He has been receiving regular supplies for his CPAP machine, including new tubes and mouthpieces. He has not needed to use his emergency albuterol inhaler.  The patient also discusses his pulmonary function test results, which showed a 72% deficiency. He has not participated in a rehab program due to scheduling conflicts. 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  CPAP download was reviewed which shows excellent control of events on 7 cm with great compliance without a single missed night.  Leak is mild.  He is very compliant.  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  Spent 235.6 min with SpO2 < 89%. ONO on CPAP/3 L 06/2023 >> T saturation more than an hour less than 88%, increased to 4 L ONO with oral appliance 08/11/22 >> test time 6 hrs 39 min. Baseline SpO2 84%, low SpO2 74%. Spent 6 hrs 35 min with an SpO2 < 88%  ONO with 5 liters and oral appliance 12/29/22 >> test time 9 hrs 40 sec.  Baseline SpO2 91%, low SpO2 79%.  Spent 23 min 32 sec with SpO2 < 88%.  Review of Systems neg for any significant sore throat, dysphagia, itching, sneezing, nasal congestion or excess/ purulent secretions, fever, chills, sweats, unintended wt loss, pleuritic or exertional cp, hempoptysis, orthopnea pnd or change in chronic leg swelling. Also denies presyncope, palpitations, heartburn, abdominal pain, nausea, vomiting, diarrhea or change in bowel or urinary habits, dysuria,hematuria, rash, arthralgias, visual complaints, headache, numbness weakness or ataxia.     Objective:   Physical Exam   Gen. Pleasant, obese, in no distress ENT - no lesions, no post nasal drip Neck: No JVD, no thyromegaly, no carotid bruits Lungs: no use of accessory muscles, no dullness to percussion, decreased without rales or rhonchi  Cardiovascular: Rhythm regular, heart sounds  normal, no murmurs or gallops, no peripheral edema Musculoskeletal: No deformities, no cyanosis or clubbing , no tremors      Assessment & Plan:    Chronic Obstructive Pulmonary Disease (  COPD) COPD with chronic respiratory failure, managed with Trelegy and Breo. Reports improved symptoms and reduced dyspnea with Trelegy. Pulmonary function tests show FEV1 of 62% pre-bronchodilator and 72% post-bronchodilator. Lung nodules previously assessed as likely benign. Discussed alternating Trelegy and Breo due to cost considerations; patient prefers this approach. - Alternate Trelegy and Breo (two days Trelegy, one day Breo or two days  each). - Continue CPAP therapy with nocturnal oxygen at 4 liters. - Order CT scan for September to monitor lung nodules.  Obstructive Sleep Apnea Effective CPAP therapy with nocturnal oxygen supplementation. Equipment and supplies managed through Medicare with regular replacements. - Continue CPAP therapy with nocturnal oxygen at 4 liters. - Ensure timely replacement of CPAP supplies through Medicare.  LLL nodule - favor hamartoma - Order CT scan for September.   General Health Maintenance Heart rate slightly elevated during the visit, no immediate cause identified. - Monitor heart rate and follow up if persistent elevation is noted.  Follow-up - Schedule follow-up visit in six months.

## 2023-12-05 NOTE — Patient Instructions (Addendum)
 Lung function is stable  OK to alternate breo & trelegy  CPAP is working well  X Ct c hest wo con in sep 2025

## 2023-12-06 ENCOUNTER — Ambulatory Visit (HOSPITAL_COMMUNITY): Payer: Medicare Other

## 2023-12-08 ENCOUNTER — Ambulatory Visit (HOSPITAL_COMMUNITY): Payer: Medicare Other

## 2023-12-13 ENCOUNTER — Ambulatory Visit (HOSPITAL_COMMUNITY): Payer: Medicare Other

## 2023-12-15 ENCOUNTER — Ambulatory Visit (HOSPITAL_COMMUNITY): Payer: Medicare Other

## 2023-12-15 LAB — BASIC METABOLIC PANEL
BUN: 24 — AB (ref 4–21)
CO2: 26 — AB (ref 13–22)
Chloride: 106 (ref 99–108)
Creatinine: 1.1 (ref 0.6–1.3)
Glucose: 96
Potassium: 4.6 meq/L (ref 3.5–5.1)
Sodium: 148 — AB (ref 137–147)

## 2023-12-15 LAB — COMPREHENSIVE METABOLIC PANEL
Albumin: 4.6 (ref 3.5–5.0)
Calcium: 9.8 (ref 8.7–10.7)
Globulin: 2.9
eGFR: 69

## 2023-12-15 LAB — CBC: RBC: 6 — AB (ref 3.87–5.11)

## 2023-12-15 LAB — CBC AND DIFFERENTIAL
HCT: 54 — AB (ref 41–53)
Hemoglobin: 18.2 — AB (ref 13.5–17.5)
Platelets: 175 10*3/uL (ref 150–400)
WBC: 10.7

## 2023-12-15 LAB — HEPATIC FUNCTION PANEL
ALT: 45 U/L — AB (ref 10–40)
AST: 31 (ref 14–40)
Alkaline Phosphatase: 106 (ref 25–125)
Bilirubin, Total: 0.7

## 2023-12-15 LAB — HEMOGLOBIN A1C: Hemoglobin A1C: 7.1

## 2023-12-16 ENCOUNTER — Encounter: Payer: Self-pay | Admitting: Internal Medicine

## 2023-12-20 ENCOUNTER — Non-Acute Institutional Stay: Payer: Medicare Other | Admitting: Internal Medicine

## 2023-12-20 ENCOUNTER — Encounter: Payer: Self-pay | Admitting: Internal Medicine

## 2023-12-20 ENCOUNTER — Ambulatory Visit (HOSPITAL_COMMUNITY): Payer: Medicare Other

## 2023-12-20 VITALS — BP 124/90 | HR 90 | Temp 97.9°F | Resp 20 | Ht 67.5 in | Wt 210.4 lb

## 2023-12-20 DIAGNOSIS — E1165 Type 2 diabetes mellitus with hyperglycemia: Secondary | ICD-10-CM

## 2023-12-20 DIAGNOSIS — R911 Solitary pulmonary nodule: Secondary | ICD-10-CM

## 2023-12-20 DIAGNOSIS — G4733 Obstructive sleep apnea (adult) (pediatric): Secondary | ICD-10-CM

## 2023-12-20 DIAGNOSIS — I10 Essential (primary) hypertension: Secondary | ICD-10-CM

## 2023-12-20 DIAGNOSIS — N401 Enlarged prostate with lower urinary tract symptoms: Secondary | ICD-10-CM

## 2023-12-20 DIAGNOSIS — M545 Low back pain, unspecified: Secondary | ICD-10-CM

## 2023-12-20 DIAGNOSIS — R600 Localized edema: Secondary | ICD-10-CM | POA: Diagnosis not present

## 2023-12-20 DIAGNOSIS — E7800 Pure hypercholesterolemia, unspecified: Secondary | ICD-10-CM

## 2023-12-20 DIAGNOSIS — G8929 Other chronic pain: Secondary | ICD-10-CM

## 2023-12-20 DIAGNOSIS — J449 Chronic obstructive pulmonary disease, unspecified: Secondary | ICD-10-CM

## 2023-12-20 NOTE — Patient Instructions (Signed)
 Get date for Tetanus shot

## 2023-12-20 NOTE — Progress Notes (Signed)
 Location:  [facility] features editor of Service:  Clinic (12)  Provider:   Code Status:  Goals of Care:     12/20/2023    9:05 AM  Advanced Directives  Does Patient Have a Medical Advance Directive? Yes  Type of Estate agent of [place];Living will;Out of facility DNR (pink MOST or yellow form)  Does patient want to make changes to medical advance directive? No - Patient declined  Copy of Healthcare Power of Attorney in Chart? No - copy requested     Chief Complaint  Patient presents with   Follow-up    3 month follow up. Discuss the need foot exam and Diabetic kidney evaluation.    HPI: Patient is a 83 y.o. male seen today for medical management of chronic diseases.   Lives in [place] in [place] with his wife    Sleep Apnea  Using CPAP  Doing well there Lung Nodule Stable on CT Dr Vassie Loll ordered Repeat CT in Sept   Diabetes Type 2 Has been Compliant with Metformin Also with his diet ? Loose Stools sometimes but overall lost some weight A1C slightly elevated but still good  Vision Changes Recent Diagnosis of Wet MD Seeing Retina Specialist Back Pain Mostly Resolved now But now has Left Knee pain Taking Tylenol and Advil as needed   Edema in his legs Resolved with Decreasing the dose of Norvasc  History of complicated renal cyst. 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   atorvastatin (LIPITOR) 10 MG tablet TAKE 1 TABLET BY MOUTH EVERY DAY   finasteride (PROSCAR) 5 MG tablet TAKE 1 TABLET BY MOUTH EVERY OTHER DAY   Fluticasone-Umeclidin-Vilant (TRELEGY ELLIPTA) 200-62.5-25 MCG/ACT AEPB Inhale 1 puff into the lungs daily.   hydrochlorothiazide (MICROZIDE) 12.5 MG capsule Take 1 capsule (12.5 mg total) by mouth daily.   ibuprofen (ADVIL) 200 MG tablet Take 400 mg by mouth every 6 (six) hours as needed for mild pain (pain score 1-3).   losartan (COZAAR) 100 MG tablet TAKE 1 TABLET BY MOUTH EVERY DAY   metFORMIN (GLUCOPHAGE) 500 MG tablet TAKE 1 TABLET BY MOUTH 2 TIMES DAILY WITH A MEAL.   Multiple Vitamins-Minerals (PRESERVISION AREDS 2) CAPS Take by mouth 2 (two) times daily.   OXYGEN Inhale 4 L into the lungs at bedtime.   tamsulosin (FLOMAX) 0.4 MG CAPS capsule TAKE 1 CAPSULE BY MOUTH EVERY DAY   No facility-administered encounter medications on file as of 12/20/2023.    Review of Systems:  Review of Systems  Constitutional:  Negative for activity change, appetite change and unexpected weight change.  HENT: Negative.    Respiratory:  Positive for cough. Negative for shortness of breath.   Cardiovascular:  Negative for leg swelling.  Gastrointestinal:  Negative for constipation.  Genitourinary:  Positive for frequency.  Musculoskeletal:  Positive for arthralgias. Negative for gait problem and myalgias.  Skin: Negative.  Negative for rash.  Neurological:  Negative for dizziness and weakness.  Psychiatric/Behavioral:  Negative for confusion and sleep disturbance.   All other systems reviewed and are negative.   Health Maintenance  Topic Date Due   Diabetic kidney evaluation - Urine ACR  Never done   FOOT EXAM  01/28/2023   COVID-19 Vaccine (9 - 2024-25 season)  01/05/2024   OPHTHALMOLOGY EXAM  02/26/2024   HEMOGLOBIN A1C  06/16/2024   Medicare Annual Wellness (AWV)  11/13/2024   Diabetic kidney evaluation - eGFR measurement  12/14/2024   Pneumonia Vaccine 48+ Years old  Completed   INFLUENZA VACCINE  Completed   Zoster Vaccines- Shingrix  Completed   HPV VACCINES  Aged Out   DTaP/Tdap/Td  Discontinued    Physical Exam: Vitals:   12/20/23 0816  BP: (!) 124/90  Pulse: 90  Resp: 20  Temp: 97.9 F (36.6 C)  SpO2: 91%  Weight: 210 lb 6.4 oz (95.4 kg)  Height: 5' 7.5" (1.715 m)   Body mass index is 32.47 kg/m. Physical Exam Vitals reviewed.  Constitutional:      Appearance: Normal appearance.  HENT:     Head: Normocephalic.     Nose: Nose normal.     Mouth/Throat:     Mouth: Mucous membranes are moist.     Pharynx: Oropharynx is clear.  Eyes:     Pupils: Pupils are equal, round, and reactive to light.  Cardiovascular:     Rate and Rhythm: Normal rate and regular rhythm.     Pulses: Normal pulses.     Heart sounds: No murmur heard. Pulmonary:     Effort: Pulmonary effort is normal. No respiratory distress.     Breath sounds: Normal breath sounds. No rales.  Abdominal:     General: Abdomen is flat. Bowel sounds are normal.     Palpations: Abdomen is soft.  Musculoskeletal:        General: No swelling.     Cervical back: Neck supple.  Skin:    General: Skin is warm.  Neurological:     General: No focal deficit present.     Mental Status: He is alert and oriented to person, place, and time.  Psychiatric:        Mood and Affect: Mood normal.        Thought Content: Thought content normal.    Labs reviewed: Basic Metabolic Panel: Recent Labs    06/07/23 0000 09/13/23 0000 12/15/23 0000  NA 147 146 148*  K 4.0 4.3 4.6  CL 106 107 106  CO2 21 22 26*  BUN 22* 21 24*  CREATININE 1.0 0.9 1.1  CALCIUM 9.4 9.7 9.8   Liver Function Tests: Recent Labs    01/27/23 0000 06/07/23 0000 12/15/23 0000  AST 25 27 31    ALT 33 34 45*  ALKPHOS 121  --  106  ALBUMIN 4.3 4.3 4.6   No results for input(s): "LIPASE", "AMYLASE" in the last 8760 hours. No results for input(s): "AMMONIA" in the last 8760 hours. CBC: Recent Labs    01/27/23 0000 06/07/23 0000 12/15/23 0000  WBC 8.6 9.2 10.7  HGB 17.2 16.4 18.2*  HCT 50 48 54*  PLT 153 165 175   Lipid Panel: Recent  Labs    01/27/23 0000 06/07/23 0000  CHOL 128 119  HDL 31* 31*  LDLCALC 27 30  TRIG 347* 290*   Lab Results  Component Value Date   HGBA1C 7.1 12/15/2023    Procedures since last visit: No results found.  Assessment/Plan 1. Type 2 diabetes mellitus with hyperglycemia, without long-term current use of insulin (HCC) (Primary) Continue Metformin Gave Literature for Ozempic Weight loss will help his  other Comorbidity  2. Primary hypertension BP doing well   3. Bilateral leg edema Resolved By Reducing Norvasc  4. Chronic obstructive pulmonary disease, unspecified COPD type (HCC) Trelegy and Breo alternate days  5. Chronic bilateral low back pain without sciatica Takes Advil and Tylenol PRN but mostly resolved  6. Pure hypercholesterolemia Statin  7. Benign prostatic hyperplasia with lower urinary tract symptoms, symptom details unspecified Proscar and Flomax  8. Lung nodule Follows with CT  9. Obstructive sleep apnea syndrome Doing well with CPAP and 4 l of Oxygen 10 Elevated Sodium Increase Free water 11 Still has Elevated HGB due to his Hypoxia and Smoking history   Labs/tests ordered:  Labs before next visit Next appt:  12/20/2023

## 2023-12-22 ENCOUNTER — Ambulatory Visit (HOSPITAL_COMMUNITY): Payer: Medicare Other

## 2023-12-27 ENCOUNTER — Ambulatory Visit (HOSPITAL_COMMUNITY): Payer: Medicare Other

## 2023-12-29 ENCOUNTER — Ambulatory Visit (HOSPITAL_COMMUNITY): Payer: Medicare Other

## 2024-01-03 ENCOUNTER — Other Ambulatory Visit: Payer: Self-pay | Admitting: Internal Medicine

## 2024-01-09 ENCOUNTER — Other Ambulatory Visit: Payer: Self-pay | Admitting: Internal Medicine

## 2024-01-22 ENCOUNTER — Other Ambulatory Visit: Payer: Self-pay | Admitting: Internal Medicine

## 2024-01-25 ENCOUNTER — Other Ambulatory Visit: Payer: Self-pay | Admitting: Internal Medicine

## 2024-02-01 ENCOUNTER — Ambulatory Visit (HOSPITAL_BASED_OUTPATIENT_CLINIC_OR_DEPARTMENT_OTHER)
Admission: RE | Admit: 2024-02-01 | Discharge: 2024-02-01 | Disposition: A | Discharge status: Home / Self Care | Source: Ambulatory Visit | Attending: Pulmonary Disease | Admitting: Pulmonary Disease

## 2024-02-01 DIAGNOSIS — R911 Solitary pulmonary nodule: Secondary | ICD-10-CM | POA: Insufficient documentation

## 2024-02-02 ENCOUNTER — Other Ambulatory Visit: Payer: Self-pay | Admitting: Internal Medicine

## 2024-02-06 ENCOUNTER — Encounter (HOSPITAL_BASED_OUTPATIENT_CLINIC_OR_DEPARTMENT_OTHER): Payer: Self-pay | Admitting: Pulmonary Disease

## 2024-04-09 ENCOUNTER — Ambulatory Visit (HOSPITAL_BASED_OUTPATIENT_CLINIC_OR_DEPARTMENT_OTHER): Payer: Self-pay | Admitting: Family

## 2024-04-09 ENCOUNTER — Ambulatory Visit (HOSPITAL_BASED_OUTPATIENT_CLINIC_OR_DEPARTMENT_OTHER)

## 2024-04-09 DIAGNOSIS — I77810 Thoracic aortic ectasia: Secondary | ICD-10-CM | POA: Diagnosis not present

## 2024-04-09 DIAGNOSIS — I10 Essential (primary) hypertension: Secondary | ICD-10-CM

## 2024-04-09 DIAGNOSIS — R600 Localized edema: Secondary | ICD-10-CM

## 2024-04-09 DIAGNOSIS — R0609 Other forms of dyspnea: Secondary | ICD-10-CM | POA: Diagnosis not present

## 2024-04-09 LAB — ECHOCARDIOGRAM COMPLETE
Area-P 1/2: 3.6 cm2
Calc EF: 45 %
S' Lateral: 3.26 cm
Single Plane A2C EF: 47 %
Single Plane A4C EF: 44.7 %

## 2024-04-20 ENCOUNTER — Other Ambulatory Visit: Payer: Self-pay | Admitting: Internal Medicine

## 2024-04-24 ENCOUNTER — Non-Acute Institutional Stay: Admitting: Internal Medicine

## 2024-04-24 ENCOUNTER — Encounter: Payer: Self-pay | Admitting: Internal Medicine

## 2024-04-24 VITALS — BP 130/84 | HR 64 | Temp 98.0°F | Ht 67.5 in | Wt 209.8 lb

## 2024-04-24 DIAGNOSIS — J449 Chronic obstructive pulmonary disease, unspecified: Secondary | ICD-10-CM | POA: Diagnosis not present

## 2024-04-24 DIAGNOSIS — I10 Essential (primary) hypertension: Secondary | ICD-10-CM

## 2024-04-24 DIAGNOSIS — E1165 Type 2 diabetes mellitus with hyperglycemia: Secondary | ICD-10-CM | POA: Diagnosis not present

## 2024-04-24 DIAGNOSIS — M545 Low back pain, unspecified: Secondary | ICD-10-CM

## 2024-04-24 DIAGNOSIS — G4733 Obstructive sleep apnea (adult) (pediatric): Secondary | ICD-10-CM

## 2024-04-24 DIAGNOSIS — G8929 Other chronic pain: Secondary | ICD-10-CM

## 2024-04-24 DIAGNOSIS — E7800 Pure hypercholesterolemia, unspecified: Secondary | ICD-10-CM

## 2024-04-24 DIAGNOSIS — N401 Enlarged prostate with lower urinary tract symptoms: Secondary | ICD-10-CM

## 2024-04-24 MED ORDER — MELOXICAM 15 MG PO TABS: 15.0000 mg | ORAL_TABLET | Freq: Every day | ORAL | 0 refills | Status: DC

## 2024-04-24 NOTE — Progress Notes (Signed)
 Location:  [facility] features editor of Service:  Clinic ([id number])  Provider:   Code Status:  Goals of Care:     12/20/2023    9:05 AM  Advanced Directives  Does Patient Have a Medical Advance Directive? Yes  Type of Estate agent of [place];Living will;Out of facility DNR (pink MOST or yellow form)  Does patient want to make changes to medical advance directive? No - Patient declined  Copy of Healthcare Power of Attorney in Chart? No - copy requested     Chief Complaint  Patient presents with   Follow-up    4 month follow up. Patient has concern about lower back and leg pain    HPI: Patient is a 83 y.o. male seen today for medical management of chronic diseases.   [place] in [place] with his wife   Back Pain Continues to be his issue Was suppose to follow with Neurosurgery but decided to wait as his wife going thorough some Health Concerns Sleep Apnea  Using CPAP  Doing well there Lung Nodule Stable on CT Dr Jude ordered Repeat CT in Sept   Diabetes Type 2 Has been Compliant with Metformin  Also with his diet ? Loose Stools sometimes but overall lost some weight Repeat A1C pending   Vision Changes Recent Diagnosis of Wet MD Seeing Retina Specialist    Edema in his legs Resolved with Decreasing the dose of Norvasc    History of complicated renal cyst. 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   atorvastatin  (LIPITOR) 10 MG tablet TAKE 1 TABLET BY MOUTH EVERY DAY   finasteride  (PROSCAR ) 5 MG tablet TAKE 1 TABLET BY MOUTH EVERY OTHER DAY   hydrochlorothiazide  (MICROZIDE ) 12.5 MG capsule TAKE 1 CAPSULE BY MOUTH EVERY DAY   ibuprofen (ADVIL) 200 MG tablet Take 400 mg by mouth every 6 (six) hours as needed for mild pain (pain score 1-3).   losartan  (COZAAR ) 100 MG tablet TAKE 1 TABLET BY MOUTH EVERY DAY   meloxicam  (MOBIC ) 15 MG tablet Take 1 tablet (15 mg total) by mouth daily.   metFORMIN  (GLUCOPHAGE ) 500 MG tablet TAKE 1 TABLET BY MOUTH TWICE A DAY WITH FOOD   Multiple Vitamins-Minerals (PRESERVISION AREDS 2) CAPS Take by mouth 2 (two) times daily.   OXYGEN Inhale 4 L into the lungs at bedtime.   tamsulosin  (FLOMAX ) 0.4 MG CAPS capsule TAKE 1 CAPSULE BY MOUTH EVERY DAY   TRELEGY ELLIPTA  200-62.5-25 MCG/ACT AEPB TAKE 1 PUFF BY MOUTH EVERY DAY   No facility-administered encounter medications on file as of 04/24/2024.    Review of Systems:  Review of Systems  Constitutional:  Negative for activity change, appetite change and unexpected weight change.  HENT: Negative.    Respiratory:  Negative for cough and shortness of breath.   Cardiovascular:  Negative for leg swelling.  Gastrointestinal:  Negative for constipation.  Genitourinary:  Negative for frequency.  Musculoskeletal:  Positive for back pain. Negative for arthralgias, gait problem and myalgias.  Skin: Negative.  Negative for rash.  Neurological:  Negative for dizziness and weakness.  Psychiatric/Behavioral:  Negative for confusion and sleep disturbance.   All other systems reviewed and are negative.   Health Maintenance  Topic Date Due   Diabetic kidney evaluation - Urine ACR  Never done    DTaP/Tdap/Td (1 - Tdap) Never done   FOOT EXAM  01/28/2023   OPHTHALMOLOGY EXAM  02/26/2024   COVID-19 Vaccine (9 - 2024-25 season) 08/02/2024 (Originally 01/05/2024)   INFLUENZA VACCINE  05/04/2024   HEMOGLOBIN A1C  06/16/2024   Medicare Annual Wellness (AWV)  11/13/2024   Diabetic kidney evaluation - eGFR measurement  12/14/2024   Pneumococcal Vaccine: 50+ Years  Completed   Zoster Vaccines- Shingrix   Completed   Hepatitis B Vaccines  Aged Out   HPV VACCINES  Aged Out   Meningococcal B Vaccine  Aged Out    Physical Exam: Vitals:   04/24/24 0935  BP: 130/84  Pulse: 64  Temp: 98 F (36.7 C)  SpO2: 94%  Weight: 209 lb 12.8 oz (95.2 kg)  Height: 5' 7.5 (1.715 m)   Body mass index is 32.37 kg/m. Physical Exam Vitals reviewed.  Constitutional:      Appearance: Normal appearance.  HENT:     Head: Normocephalic.     Nose: Nose normal.     Mouth/Throat:     Mouth: Mucous membranes are moist.     Pharynx: Oropharynx is clear.  Eyes:     Pupils: Pupils are equal, round, and reactive to light.  Cardiovascular:     Rate and Rhythm: Normal rate and regular rhythm.     Pulses: Normal pulses.     Heart sounds: No murmur heard. Pulmonary:     Effort: Pulmonary effort is normal. No respiratory distress.     Breath sounds: Normal breath sounds. No rales.  Abdominal:     General: Abdomen is flat. Bowel sounds are normal.     Palpations: Abdomen is soft.  Musculoskeletal:        General: No swelling.     Cervical back: Neck supple.  Skin:    General: Skin is warm.  Neurological:     General: No focal deficit present.     Mental Status: He is alert and oriented to person, place, and time.  Psychiatric:        Mood and Affect: Mood normal.        Thought Content: Thought content normal.     Labs reviewed: Basic Metabolic Panel: Recent Labs    06/07/23 0000 09/13/23 0000 12/15/23 0000  NA 147 146 148*  K 4.0 4.3 4.6  CL 106 107 106  CO2 21 22 26*  BUN 22* 21 24*   CREATININE 1.0 0.9 1.1  CALCIUM  9.4 9.7 9.8   Liver Function Tests: Recent Labs    06/07/23 0000 12/15/23 0000  AST 27 31  ALT 34 45*  ALKPHOS  --  106  ALBUMIN 4.3 4.6   No results for input(s): LIPASE, AMYLASE in the last 8760 hours. No results for input(s): AMMONIA in the last 8760 hours. CBC: Recent Labs  06/07/23 0000 12/15/23 0000  WBC 9.2 10.7  HGB 16.4 18.2*  HCT 48 54*  PLT 165 175   Lipid Panel: Recent Labs    06/07/23 0000  CHOL 119  HDL 31*  LDLCALC 30  TRIG 709*   Lab Results  Component Value Date   HGBA1C 7.1 12/15/2023    Procedures since last visit: ECHOCARDIOGRAM COMPLETE Result Date: 04/09/2024    ECHOCARDIOGRAM REPORT   Patient Name:   Steven Davies [facility] Date of Exam: 04/09/2024 Medical Rec #:  [id number]       Height:       67.5 in Accession #:    [id number]      Weight:       210.4 lb Date of Birth:  1941-08-21      BSA:          2.078 m Patient Age:    82 years        BP:           110/60 mmHg Patient Gender: M               HR:           76 bpm. Exam Location:  Outpatient Procedure: 2D Echo, 3D Echo, Cardiac Doppler, Color Doppler and Strain Analysis            (Both Spectral and Color Flow Doppler were utilized during            procedure). Indications:    Aortic Dilatation  History:        Patient has prior history of Echocardiogram examinations, most                 recent 04/19/2023. COPD; Risk Factors:Former Smoker, Diabetes,                 Hypertension and Dyslipidemia.  Sonographer:    Orvil Holmes RDCS Referring Phys: [id number] CAITLIN S WALKER IMPRESSIONS  1. Left ventricular ejection fraction, by estimation, is 45 to 50%. Left ventricular ejection fraction by 3D volume is 48 %. The left ventricle has mildly decreased function. The left ventricle demonstrates global hypokinesis. Left ventricular diastolic  parameters are consistent with Grade I diastolic dysfunction (impaired relaxation). The average left ventricular global longitudinal  strain is -14.0 %. The global longitudinal strain is abnormal.  2. Right ventricular systolic function is normal. The right ventricular size is normal. Tricuspid regurgitation signal is inadequate for assessing PA pressure.  3. The mitral valve is grossly normal. No evidence of mitral valve regurgitation.  4. The aortic valve is tricuspid. Aortic valve regurgitation is not visualized.  5. Aortic dilatation noted. There is borderline dilatation of the ascending aorta, measuring 39 mm. There is borderline dilatation of the aortic root, measuring 38 mm.  6. The inferior vena cava is normal in size with greater than 50% respiratory variability, suggesting right atrial pressure of 3 mmHg. Comparison(s): Changes from prior study are noted. 04/19/2023: LVEF 55-60%, aortic root 39 mm. FINDINGS  Left Ventricle: Left ventricular ejection fraction, by estimation, is 45 to 50%. Left ventricular ejection fraction by 3D volume is 48 %. The left ventricle has mildly decreased function. The left ventricle demonstrates global hypokinesis. The average left ventricular global longitudinal strain is -14.0 %. Strain was performed and the global longitudinal strain is abnormal. The left ventricular internal cavity size was normal in size. There is no left ventricular hypertrophy. Left ventricular diastolic parameters are consistent with Grade I diastolic dysfunction (impaired relaxation). Indeterminate  filling pressures. Right Ventricle: The right ventricular size is normal. No increase in right ventricular wall thickness. Right ventricular systolic function is normal. Tricuspid regurgitation signal is inadequate for assessing PA pressure. Left Atrium: Left atrial size was normal in size. Right Atrium: Right atrial size was normal in size. Pericardium: There is no evidence of pericardial effusion. Mitral Valve: The mitral valve is grossly normal. No evidence of mitral valve regurgitation. Tricuspid Valve: The tricuspid valve is grossly  normal. Tricuspid valve regurgitation is trivial. Aortic Valve: The aortic valve is tricuspid. Aortic valve regurgitation is not visualized. Pulmonic Valve: The pulmonic valve was normal in structure. Pulmonic valve regurgitation is not visualized. Aorta: Aortic dilatation noted. There is borderline dilatation of the ascending aorta, measuring 39 mm. There is borderline dilatation of the aortic root, measuring 38 mm. Venous: The inferior vena cava is normal in size with greater than 50% respiratory variability, suggesting right atrial pressure of 3 mmHg. IAS/Shunts: No atrial level shunt detected by color flow Doppler. Additional Comments: 3D was performed not requiring image post processing on an independent workstation and was abnormal.  LEFT VENTRICLE PLAX 2D LVIDd:         5.03 cm         Diastology LVIDs:         3.26 cm         LV e' medial:    4.68 cm/s LV PW:         1.14 cm         LV E/e' medial:  11.9 LV IVS:        0.93 cm         LV e' lateral:   7.62 cm/s LVOT diam:     2.20 cm         LV E/e' lateral: 7.3 LV SV:         62 LV SV Index:   30              2D Longitudinal LVOT Area:     3.80 cm        Strain                                2D Strain GLS   -15.0 %                                (A4C): LV Volumes (MOD)               2D Strain GLS   -15.0 % LV vol d, MOD    149.1 ml      (A3C): A2C:                           2D Strain GLS   -12.1 % LV vol d, MOD    142.8 ml      (A2C): A4C:                           2D Strain GLS   -14.0 % LV vol s, MOD    79.0 ml       Avg: A2C: LV vol s, MOD    79.0 ml       3D Volume EF A4C:  LV 3D EF:    Left LV SV MOD A2C:   70.1 ml                    ventricul LV SV MOD A4C:   142.8 ml                   ar LV SV MOD BP:    64.8 ml                    ejection                                             fraction                                             by 3D                                             volume is                                              48 %.                                 3D Volume EF:                                3D EF:        48 %                                LV EDV:       159 ml                                LV ESV:       83 ml                                LV SV:        77 ml RIGHT VENTRICLE RV S prime:     20.80 cm/s TAPSE (M-mode): 2.2 cm LEFT ATRIUM             Index        RIGHT ATRIUM           Index LA diam:        4.30 cm 2.07 cm/m   RA Area:     19.40 cm LA Vol (A2C):   56.5 ml 27.19 ml/m  RA Volume:   49.40 ml  23.78 ml/m LA Vol (A4C):   60.2 ml 28.97 ml/m LA Biplane Vol: 58.3 ml 28.06 ml/m  AORTIC VALVE LVOT Vmax:   78.60 cm/s LVOT Vmean:  51.900 cm/s LVOT VTI:  0.164 m  AORTA Ao Root diam: 3.80 cm Ao Asc diam:  3.75 cm MITRAL VALVE MV Area (PHT): 3.60 cm    SHUNTS MV Decel Time: 211 msec    Systemic VTI:  0.16 m MV E velocity: 55.50 cm/s  Systemic Diam: 2.20 cm MV A velocity: 82.30 cm/s MV E/A ratio:  0.67 Vinie Maxcy MD Electronically signed by Vinie Maxcy MD Signature Date/Time: 04/09/2024/1:49:17 PM    Final     Assessment/Plan 1. Type 2 diabetes mellitus with hyperglycemia, without long-term current use of insulin (HCC) (Primary) Repeat A1C ordered Have discussed GLP 1 inhibitors 2. Chronic bilateral low back pain without sciatica Order Meloxicam  He told me he will let me know when he is ready for further work up Discussed side effects on chronic NSAIDS use 3. Chronic obstructive pulmonary disease, unspecified COPD type (HCC) Trelegy is helping   4. Primary hypertension Norvasc  and metoprolol   5. Pure hypercholesterolemia statin  6. Obstructive sleep apnea syndrome Doing well with CPAP  7. Benign prostatic hyperplasia with lower urinary tract symptoms, symptom details unspecified Proscar  and Flomax     Labs/tests ordered:  * No order type specified * Next appt:  Visit date not found

## 2024-04-27 ENCOUNTER — Other Ambulatory Visit (HOSPITAL_COMMUNITY): Payer: Self-pay

## 2024-04-27 ENCOUNTER — Ambulatory Visit (HOSPITAL_BASED_OUTPATIENT_CLINIC_OR_DEPARTMENT_OTHER): Admitting: Family

## 2024-04-27 ENCOUNTER — Telehealth: Payer: Self-pay

## 2024-04-27 ENCOUNTER — Encounter (HOSPITAL_BASED_OUTPATIENT_CLINIC_OR_DEPARTMENT_OTHER): Payer: Self-pay | Admitting: Family

## 2024-04-27 VITALS — BP 146/88 | HR 68 | Ht 69.0 in | Wt 205.7 lb

## 2024-04-27 DIAGNOSIS — E785 Hyperlipidemia, unspecified: Secondary | ICD-10-CM

## 2024-04-27 DIAGNOSIS — I10 Essential (primary) hypertension: Secondary | ICD-10-CM

## 2024-04-27 DIAGNOSIS — R0609 Other forms of dyspnea: Secondary | ICD-10-CM

## 2024-04-27 DIAGNOSIS — I5022 Chronic systolic (congestive) heart failure: Secondary | ICD-10-CM

## 2024-04-27 DIAGNOSIS — I2510 Atherosclerotic heart disease of native coronary artery without angina pectoris: Secondary | ICD-10-CM

## 2024-04-27 DIAGNOSIS — G4733 Obstructive sleep apnea (adult) (pediatric): Secondary | ICD-10-CM | POA: Diagnosis not present

## 2024-04-27 MED ORDER — METOPROLOL TARTRATE 100 MG PO TABS
ORAL_TABLET | ORAL | 0 refills | Status: DC
Start: 2024-04-27 — End: 2024-06-15

## 2024-04-27 MED ORDER — SACUBITRIL-VALSARTAN 97-103 MG PO TABS: 1.0000 | ORAL_TABLET | Freq: Two times a day (BID) | ORAL | 11 refills | Status: AC

## 2024-04-27 NOTE — Telephone Encounter (Signed)
 Pharmacy Patient Advocate Encounter   Received notification from Physician's Office that prior authorization for ENTRESTO is required/requested.   Insurance verification completed.   The patient is insured through [employer] .   Per test claim: Refill too soon. PA is not needed at this time. Medication was filled 04/27/24. Next eligible fill date is 05/21/24.

## 2024-04-27 NOTE — H&P (View-Only) (Signed)
 Cardiology Office Note   Date:  05/02/2024  ID:  Steven Davies 12/08/1940, MRN [id number] PCP: Charlanne Fredia CROME, MD  [facility] Providers Cardiologist:  Shelda Bruckner, MD     History of Present Illness  Discussed the use of AI scribe software for clinical note transcription with the patient, who gave verbal consent to proceed.  History of Present Illness Steven Davies Art is an 83 year old male with hx of HTN, COPD, OSA, DM2, HFrEF who presents for evaluation of decreased heart function.  Echocardiogram 04/2024 LVEF 45-50%, LV global hypokinesis, gr1dd, RV normal, borderline dilation ascending aorta 39mm and aortic root 38mm.  He experiences no chest pain, pressure, or tightness. Current medications include losartan , amlodipine , and hydrochlorothiazide . Home blood pressure averages in the 130s/80s to low 90s, slightly elevated from previous readings. He reduced amlodipine  from ten to five milligrams due to ankle swelling. A nuclear stress test was conducted prior to 2006, and CT scans for lung nodules have shown incidental plaque buildup in heart arteries. His brother has undergone cardiac catheterization.  He has COPD and reports improved breathing since switching to Trelegy. He engages in light water exercise twice a week, with significant improvement in shortness of breath, though uphill walking remains challenging. He uses a CPAP machine and an oxygen concentrator at four liters per minute for sleep apnea.  He consumes alcohol three to four times a week, primarily wine and diluted liquor, totaling under ten ounces of alcohol per week. Fluid intake is less than 64 ounces daily, mainly water, with occasional soft drinks and fruit juice. He is on metformin  for diabetes, diagnosed last year.  ROS: Please see the history of present illness.    All other systems reviewed and are negative.   Studies Reviewed EKG Interpretation Date/Time:  Friday April 27 2024  10:57:09 EDT Ventricular Rate:  68 PR Interval:  178 QRS Duration:  80 QT Interval:  382 QTC Calculation: 406 R Axis:   -59  Text Interpretation: Normal sinus rhythm with sinus arrhythmia Left axis deviation No acute ST/T wave changes. Confirmed by Vannie Mora ([id number]) on 05/02/2024 2:27:03 PM    Cardiac Studies & Procedures   ______________________________________________________________________________________________     ECHOCARDIOGRAM  ECHOCARDIOGRAM COMPLETE 04/09/2024  Narrative ECHOCARDIOGRAM REPORT    Patient Name:   Steven Davies [facility] Date of Exam: 04/09/2024 Medical Rec #:  [id number]       Height:       67.5 in Accession #:    [id number]      Weight:       210.4 lb Date of Birth:  Mar 30, 1941      BSA:          2.078 m Patient Age:    82 years        BP:           110/60 mmHg Patient Gender: M               HR:           76 bpm. Exam Location:  Outpatient  Procedure: 2D Echo, 3D Echo, Cardiac Doppler, Color Doppler and Strain Analysis (Both Spectral and Color Flow Doppler were utilized during procedure).  Indications:    Aortic Dilatation  History:        Patient has prior history of Echocardiogram examinations, most recent 04/19/2023. COPD; Risk Factors:Former Smoker, Diabetes, Hypertension and Dyslipidemia.  Sonographer:    Orvil Holmes RDCS Referring Phys: [phone] Yahsir Wickens S Doris Gruhn  IMPRESSIONS   1. Left ventricular ejection fraction, by estimation, is 45 to 50%. Left ventricular ejection fraction by 3D volume is 48 %. The left ventricle has mildly decreased function. The left ventricle demonstrates global hypokinesis. Left ventricular diastolic parameters are consistent with Grade I diastolic dysfunction (impaired relaxation). The average left ventricular global longitudinal strain is -14.0 %. The global longitudinal strain is abnormal. 2. Right ventricular systolic function is normal. The right ventricular size is normal. Tricuspid regurgitation signal is  inadequate for assessing PA pressure. 3. The mitral valve is grossly normal. No evidence of mitral valve regurgitation. 4. The aortic valve is tricuspid. Aortic valve regurgitation is not visualized. 5. Aortic dilatation noted. There is borderline dilatation of the ascending aorta, measuring 39 mm. There is borderline dilatation of the aortic root, measuring 38 mm. 6. The inferior vena cava is normal in size with greater than 50% respiratory variability, suggesting right atrial pressure of 3 mmHg.  Comparison(s): Changes from prior study are noted. 04/19/2023: LVEF 55-60%, aortic root 39 mm.  FINDINGS Left Ventricle: Left ventricular ejection fraction, by estimation, is 45 to 50%. Left ventricular ejection fraction by 3D volume is 48 %. The left ventricle has mildly decreased function. The left ventricle demonstrates global hypokinesis. The average left ventricular global longitudinal strain is -14.0 %. Strain was performed and the global longitudinal strain is abnormal. The left ventricular internal cavity size was normal in size. There is no left ventricular hypertrophy. Left ventricular diastolic parameters are consistent with Grade I diastolic dysfunction (impaired relaxation). Indeterminate filling pressures.  Right Ventricle: The right ventricular size is normal. No increase in right ventricular wall thickness. Right ventricular systolic function is normal. Tricuspid regurgitation signal is inadequate for assessing PA pressure.  Left Atrium: Left atrial size was normal in size.  Right Atrium: Right atrial size was normal in size.  Pericardium: There is no evidence of pericardial effusion.  Mitral Valve: The mitral valve is grossly normal. No evidence of mitral valve regurgitation.  Tricuspid Valve: The tricuspid valve is grossly normal. Tricuspid valve regurgitation is trivial.  Aortic Valve: The aortic valve is tricuspid. Aortic valve regurgitation is not visualized.  Pulmonic Valve:  The pulmonic valve was normal in structure. Pulmonic valve regurgitation is not visualized.  Aorta: Aortic dilatation noted. There is borderline dilatation of the ascending aorta, measuring 39 mm. There is borderline dilatation of the aortic root, measuring 38 mm.  Venous: The inferior vena cava is normal in size with greater than 50% respiratory variability, suggesting right atrial pressure of 3 mmHg.  IAS/Shunts: No atrial level shunt detected by color flow Doppler.  Additional Comments: 3D was performed not requiring image post processing on an independent workstation and was abnormal.   LEFT VENTRICLE PLAX 2D LVIDd:         5.03 cm         Diastology LVIDs:         3.26 cm         LV e' medial:    4.68 cm/s LV PW:         1.14 cm         LV E/e' medial:  11.9 LV IVS:        0.93 cm         LV e' lateral:   7.62 cm/s LVOT diam:     2.20 cm         LV E/e' lateral: 7.3 LV SV:         62  LV SV Index:   30              2D Longitudinal LVOT Area:     3.80 cm        Strain 2D Strain GLS   -15.0 % (A4C): LV Volumes (MOD)               2D Strain GLS   -15.0 % LV vol d, MOD    149.1 ml      (A3C): A2C:                           2D Strain GLS   -12.1 % LV vol d, MOD    142.8 ml      (A2C): A4C:                           2D Strain GLS   -14.0 % LV vol s, MOD    79.0 ml       Avg: A2C: LV vol s, MOD    79.0 ml       3D Volume EF A4C:                           LV 3D EF:    Left LV SV MOD A2C:   70.1 ml                    ventricul LV SV MOD A4C:   142.8 ml                   ar LV SV MOD BP:    64.8 ml                    ejection fraction by 3D volume is 48 %.  3D Volume EF: 3D EF:        48 % LV EDV:       159 ml LV ESV:       83 ml LV SV:        77 ml  RIGHT VENTRICLE RV S prime:     20.80 cm/s TAPSE (M-mode): 2.2 cm  LEFT ATRIUM             Index        RIGHT ATRIUM           Index LA diam:        4.30 cm 2.07 cm/m   RA Area:     19.40 cm LA Vol (A2C):   56.5 ml 27.19  ml/m  RA Volume:   49.40 ml  23.78 ml/m LA Vol (A4C):   60.2 ml 28.97 ml/m LA Biplane Vol: 58.3 ml 28.06 ml/m AORTIC VALVE LVOT Vmax:   78.60 cm/s LVOT Vmean:  51.900 cm/s LVOT VTI:    0.164 m  AORTA Ao Root diam: 3.80 cm Ao Asc diam:  3.75 cm  MITRAL VALVE MV Area (PHT): 3.60 cm    SHUNTS MV Decel Time: 211 msec    Systemic VTI:  0.16 m MV E velocity: 55.50 cm/s  Systemic Diam: 2.20 cm MV A velocity: 82.30 cm/s MV E/A ratio:  0.67  Vinie Maxcy MD Electronically signed by Vinie Maxcy MD Signature Date/Time: 04/09/2024/1:49:17 PM    Final          ______________________________________________________________________________________________       Risk Assessment/Calculations  Physical Exam VS:  BP (!) 146/88   Pulse 68   Ht 5' 9 (1.753 m)   Wt 205 lb 11.2 oz (93.3 kg)   SpO2 92%   BMI 30.38 kg/m        Wt Readings from Last 3 Encounters:  04/27/24 205 lb 11.2 oz (93.3 kg)  04/24/24 209 lb 12.8 oz (95.2 kg)  12/20/23 210 lb 6.4 oz (95.4 kg)    GEN: Well nourished, well developed in no acute distress NECK: No JVD; No carotid bruits CARDIAC: RRR, no murmurs, rubs, gallops RESPIRATORY:  Clear to auscultation without rales, wheezing or rhonchi  ABDOMEN: Soft, non-tender, non-distended EXTREMITIES:  No edema; No deformity   ASSESSMENT AND PLAN  Assessment & 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 Unknown etiology, plan for ischemic evaluation given coronary calcification on prior CT imaging. Euvolemic and well compensated on exam.  - Order cardiac CTA scan to assess coronary artery plaque buildup. - Switch losartan  to Entresto  97-103mg  BID to improve heart function and control blood pressure. -Consider MRA vs SGLT2i at follow up.  - Coordinate with Dr. Charlanne for blood work (BMET) next week to monitor kidney function.  Hypertension Recent readings in the 130s/80s-90s  range, slightly higher than previous levels. Blood pressure expected to improve with switch to Entresto . - Switch losartan  to Entresto  to improve blood pressure control, as above.  -continue amlodipine  5mg  daily, hydrochlorothiazide  12.5mg  daily.   Chronic obstructive pulmonary disease (COPD) Improved symptoms with transition from Breo Ellipta  to Trelegy. Followed by pulmonology.   Obstructive sleep apnea Managed with CPAP and oxygen concentrator. Followed by pulmonology.   Type 2 diabetes mellitus Continue to follow with PCP.          Dispo: follow up in 6-8 weeks  Signed, Reche GORMAN Finder, NP

## 2024-04-27 NOTE — Patient Instructions (Signed)
 Medication Instructions:   DISCONTINUE  Losartan    START Entresto one (1) tablet by mouth ( 97-103 mg) twice daily.  *If you need a refill on your cardiac medications before your next appointment, please call your pharmacy*  Lab Work:  None ordered.  If you have labs (blood work) drawn today and your tests are completely normal, you will receive your results only by: MyChart Message (if you have MyChart) OR A paper copy in the mail If you have any lab test that is abnormal or we need to change your treatment, we will call you to review the results.  Testing/Procedures:    Your cardiac CT will be scheduled at one of the below locations:    [facility] [address]  [place], [place] [address]   If scheduled at the [facility] and V[facility] at [address], please enter the parking lot using the [address] entrance and use the FREE valet service at the patient drop-off area. Enter the buidling and check-in with registration on the main floor.   Please follow these instructions carefully (unless otherwise directed):  An IV will be required for this test and Nitroglycerin will be given.  Hold all erectile dysfunction medications at least 3 days (72 hrs) prior to test. (Ie viagra, cialis, sildenafil, tadalafil, etc)   On the Night Before the Test: Be sure to Drink plenty of water. Do not consume any caffeinated/decaffeinated beverages or chocolate 12 hours prior to your test. Do not take any antihistamines 12 hours prior to your test.  On the Day of the Test: Drink plenty of water until 1 hour prior to the test. Do not eat any food 1 hour prior to test. You may take your regular medications prior to the test.  Take metoprolol (Lopressor) one (1) tablet by mouth ( 100 mg) two hours prior to test. If you take Hydrochlorothiazide  please HOLD on the morning of the test.      After the Test: Drink plenty of water. After receiving IV  contrast, you may experience a mild flushed feeling. This is normal. On occasion, you may experience a mild rash up to 24 hours after the test. This is not dangerous. If this occurs, you can take Benadryl 25 mg, Zyrtec, Claritin, or Allegra and increase your fluid intake. (Patients taking Tikosyn should avoid Benadryl, and may take Zyrtec, Claritin, or Allegra) If you experience trouble breathing, this can be serious. If it is severe call 911 IMMEDIATELY. If it is mild, please call our office.  We will call to schedule your test 2-4 weeks out understanding that some insurance companies will need an authorization prior to the service being performed.   For more information and frequently asked questions, please visit our website : [email or web address]  For non-scheduling related questions, please contact the cardiac imaging nurse navigator should you have any questions/concerns: Cardiac Imaging Nurse Navigators Direct Office Dial: [phone]   For scheduling needs, including cancellations and rescheduling, please call [place], [phone].   Follow-Up: At [facility], you and your health needs are our priority.  As part of our continuing mission to provide you with exceptional heart care, our providers are all part of one team.  This team includes your primary Cardiologist (physician) and Advanced Practice Providers or APPs (Physician Assistants and Nurse Practitioners) who all work together to provide you with the care you need, when you need it.  Your next appointment:   7 week(s)  Provider:   Caitlin  Vannie, NP    We recommend signing up for the patient portal called MyChart.  Sign up information is provided on this After Visit Summary.  MyChart is used to connect with patients for Virtual Visits (Telemedicine).  Patients are able to view lab/test results, encounter notes, upcoming appointments, etc.  Non-urgent messages can be sent to your provider as well.    To learn more about what you can do with MyChart, go to [email or web address].

## 2024-04-27 NOTE — Progress Notes (Signed)
 Cardiology Office Note   Date:  05/02/2024  ID:  Steven Davies 12/08/1940, MRN [id number] PCP: Charlanne Fredia CROME, MD  [facility] Providers Cardiologist:  Shelda Bruckner, MD     History of Present Illness  Discussed the use of AI scribe software for clinical note transcription with the patient, who gave verbal consent to proceed.  History of Present Illness Steven Davies Steven Davies is an 83 year old male with hx of HTN, COPD, OSA, DM2, HFrEF who presents for evaluation of decreased heart function.  Echocardiogram 04/2024 LVEF 45-50%, LV global hypokinesis, gr1dd, RV normal, borderline dilation ascending aorta 39mm and aortic root 38mm.  He experiences no chest pain, pressure, or tightness. Current medications include losartan , amlodipine , and hydrochlorothiazide . Home blood pressure averages in the 130s/80s to low 90s, slightly elevated from previous readings. He reduced amlodipine  from ten to five milligrams due to ankle swelling. A nuclear stress test was conducted prior to 2006, and CT scans for lung nodules have shown incidental plaque buildup in heart arteries. His brother has undergone cardiac catheterization.  He has COPD and reports improved breathing since switching to Trelegy. He engages in light water exercise twice a week, with significant improvement in shortness of breath, though uphill walking remains challenging. He uses a CPAP machine and an oxygen concentrator at four liters per minute for sleep apnea.  He consumes alcohol three to four times a week, primarily wine and diluted liquor, totaling under ten ounces of alcohol per week. Fluid intake is less than 64 ounces daily, mainly water, with occasional soft drinks and fruit juice. He is on metformin  for diabetes, diagnosed last year.  ROS: Please see the history of present illness.    All other systems reviewed and are negative.   Studies Reviewed EKG Interpretation Date/Time:  Friday April 27 2024  10:57:09 EDT Ventricular Rate:  68 PR Interval:  178 QRS Duration:  80 QT Interval:  382 QTC Calculation: 406 R Axis:   -59  Text Interpretation: Normal sinus rhythm with sinus arrhythmia Left axis deviation No acute ST/T wave changes. Confirmed by Vannie Mora ([id number]) on 05/02/2024 2:27:03 PM    Cardiac Studies & Procedures   ______________________________________________________________________________________________     ECHOCARDIOGRAM  ECHOCARDIOGRAM COMPLETE 04/09/2024  Narrative ECHOCARDIOGRAM REPORT    Patient Name:   Steven Davies [facility] Date of Exam: 04/09/2024 Medical Rec #:  [id number]       Height:       67.5 in Accession #:    [id number]      Weight:       210.4 lb Date of Birth:  Mar 30, 1941      BSA:          2.078 m Patient Age:    82 years        BP:           110/60 mmHg Patient Gender: M               HR:           76 bpm. Exam Location:  Outpatient  Procedure: 2D Echo, 3D Echo, Cardiac Doppler, Color Doppler and Strain Analysis (Both Spectral and Color Flow Doppler were utilized during procedure).  Indications:    Aortic Dilatation  History:        Patient has prior history of Echocardiogram examinations, most recent 04/19/2023. COPD; Risk Factors:Former Smoker, Diabetes, Hypertension and Dyslipidemia.  Sonographer:    Orvil Holmes RDCS Referring Phys: [phone] Yahsir Wickens S Doris Gruhn  IMPRESSIONS   1. Left ventricular ejection fraction, by estimation, is 45 to 50%. Left ventricular ejection fraction by 3D volume is 48 %. The left ventricle has mildly decreased function. The left ventricle demonstrates global hypokinesis. Left ventricular diastolic parameters are consistent with Grade I diastolic dysfunction (impaired relaxation). The average left ventricular global longitudinal strain is -14.0 %. The global longitudinal strain is abnormal. 2. Right ventricular systolic function is normal. The right ventricular size is normal. Tricuspid regurgitation signal is  inadequate for assessing PA pressure. 3. The mitral valve is grossly normal. No evidence of mitral valve regurgitation. 4. The aortic valve is tricuspid. Aortic valve regurgitation is not visualized. 5. Aortic dilatation noted. There is borderline dilatation of the ascending aorta, measuring 39 mm. There is borderline dilatation of the aortic root, measuring 38 mm. 6. The inferior vena cava is normal in size with greater than 50% respiratory variability, suggesting right atrial pressure of 3 mmHg.  Comparison(s): Changes from prior study are noted. 04/19/2023: LVEF 55-60%, aortic root 39 mm.  FINDINGS Left Ventricle: Left ventricular ejection fraction, by estimation, is 45 to 50%. Left ventricular ejection fraction by 3D volume is 48 %. The left ventricle has mildly decreased function. The left ventricle demonstrates global hypokinesis. The average left ventricular global longitudinal strain is -14.0 %. Strain was performed and the global longitudinal strain is abnormal. The left ventricular internal cavity size was normal in size. There is no left ventricular hypertrophy. Left ventricular diastolic parameters are consistent with Grade I diastolic dysfunction (impaired relaxation). Indeterminate filling pressures.  Right Ventricle: The right ventricular size is normal. No increase in right ventricular wall thickness. Right ventricular systolic function is normal. Tricuspid regurgitation signal is inadequate for assessing PA pressure.  Left Atrium: Left atrial size was normal in size.  Right Atrium: Right atrial size was normal in size.  Pericardium: There is no evidence of pericardial effusion.  Mitral Valve: The mitral valve is grossly normal. No evidence of mitral valve regurgitation.  Tricuspid Valve: The tricuspid valve is grossly normal. Tricuspid valve regurgitation is trivial.  Aortic Valve: The aortic valve is tricuspid. Aortic valve regurgitation is not visualized.  Pulmonic Valve:  The pulmonic valve was normal in structure. Pulmonic valve regurgitation is not visualized.  Aorta: Aortic dilatation noted. There is borderline dilatation of the ascending aorta, measuring 39 mm. There is borderline dilatation of the aortic root, measuring 38 mm.  Venous: The inferior vena cava is normal in size with greater than 50% respiratory variability, suggesting right atrial pressure of 3 mmHg.  IAS/Shunts: No atrial level shunt detected by color flow Doppler.  Additional Comments: 3D was performed not requiring image post processing on an independent workstation and was abnormal.   LEFT VENTRICLE PLAX 2D LVIDd:         5.03 cm         Diastology LVIDs:         3.26 cm         LV e' medial:    4.68 cm/s LV PW:         1.14 cm         LV E/e' medial:  11.9 LV IVS:        0.93 cm         LV e' lateral:   7.62 cm/s LVOT diam:     2.20 cm         LV E/e' lateral: 7.3 LV SV:         62  LV SV Index:   30              2D Longitudinal LVOT Area:     3.80 cm        Strain 2D Strain GLS   -15.0 % (A4C): LV Volumes (MOD)               2D Strain GLS   -15.0 % LV vol d, MOD    149.1 ml      (A3C): A2C:                           2D Strain GLS   -12.1 % LV vol d, MOD    142.8 ml      (A2C): A4C:                           2D Strain GLS   -14.0 % LV vol s, MOD    79.0 ml       Avg: A2C: LV vol s, MOD    79.0 ml       3D Volume EF A4C:                           LV 3D EF:    Left LV SV MOD A2C:   70.1 ml                    ventricul LV SV MOD A4C:   142.8 ml                   ar LV SV MOD BP:    64.8 ml                    ejection fraction by 3D volume is 48 %.  3D Volume EF: 3D EF:        48 % LV EDV:       159 ml LV ESV:       83 ml LV SV:        77 ml  RIGHT VENTRICLE RV S prime:     20.80 cm/s TAPSE (M-mode): 2.2 cm  LEFT ATRIUM             Index        RIGHT ATRIUM           Index LA diam:        4.30 cm 2.07 cm/m   RA Area:     19.40 cm LA Vol (A2C):   56.5 ml 27.19  ml/m  RA Volume:   49.40 ml  23.78 ml/m LA Vol (A4C):   60.2 ml 28.97 ml/m LA Biplane Vol: 58.3 ml 28.06 ml/m AORTIC VALVE LVOT Vmax:   78.60 cm/s LVOT Vmean:  51.900 cm/s LVOT VTI:    0.164 m  AORTA Ao Root diam: 3.80 cm Ao Asc diam:  3.75 cm  MITRAL VALVE MV Area (PHT): 3.60 cm    SHUNTS MV Decel Time: 211 msec    Systemic VTI:  0.16 m MV E velocity: 55.50 cm/s  Systemic Diam: 2.20 cm MV A velocity: 82.30 cm/s MV E/A ratio:  0.67  Vinie Maxcy MD Electronically signed by Vinie Maxcy MD Signature Date/Time: 04/09/2024/1:49:17 PM    Final          ______________________________________________________________________________________________       Risk Assessment/Calculations  Physical Exam VS:  BP (!) 146/88   Pulse 68   Ht 5' 9 (1.753 m)   Wt 205 lb 11.2 oz (93.3 kg)   SpO2 92%   BMI 30.38 kg/m        Wt Readings from Last 3 Encounters:  04/27/24 205 lb 11.2 oz (93.3 kg)  04/24/24 209 lb 12.8 oz (95.2 kg)  12/20/23 210 lb 6.4 oz (95.4 kg)    GEN: Well nourished, well developed in no acute distress NECK: No JVD; No carotid bruits CARDIAC: RRR, no murmurs, rubs, gallops RESPIRATORY:  Clear to auscultation without rales, wheezing or rhonchi  ABDOMEN: Soft, non-tender, non-distended EXTREMITIES:  No edema; No deformity   ASSESSMENT AND PLAN  Assessment & 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 Unknown etiology, plan for ischemic evaluation given coronary calcification on prior CT imaging. Euvolemic and well compensated on exam.  - Order cardiac CTA scan to assess coronary artery plaque buildup. - Switch losartan  to Entresto  97-103mg  BID to improve heart function and control blood pressure. -Consider MRA vs SGLT2i at follow up.  - Coordinate with Dr. Charlanne for blood work (BMET) next week to monitor kidney function.  Hypertension Recent readings in the 130s/80s-90s  range, slightly higher than previous levels. Blood pressure expected to improve with switch to Entresto . - Switch losartan  to Entresto  to improve blood pressure control, as above.  -continue amlodipine  5mg  daily, hydrochlorothiazide  12.5mg  daily.   Chronic obstructive pulmonary disease (COPD) Improved symptoms with transition from Breo Ellipta  to Trelegy. Followed by pulmonology.   Obstructive sleep apnea Managed with CPAP and oxygen concentrator. Followed by pulmonology.   Type 2 diabetes mellitus Continue to follow with PCP.          Dispo: follow up in 6-8 weeks  Signed, Reche GORMAN Finder, NP

## 2024-04-27 NOTE — Telephone Encounter (Signed)
-----   Message from [facility] Edsel MATSU sent at 04/27/2024 11:36 AM EDT ----- Sim please

## 2024-05-01 LAB — COMPREHENSIVE METABOLIC PANEL WITH GFR
Albumin: 4.3 (ref 3.5–5.0)
Calcium: 9.7 (ref 8.7–10.7)
Globulin: 2.6
eGFR: 70

## 2024-05-01 LAB — LIPID PANEL
Cholesterol: 114 (ref 0–200)
HDL: 27 — AB (ref 35–70)
LDl/HDL Ratio: 4.2
Triglycerides: 425 — AB (ref 40–160)

## 2024-05-01 LAB — HEPATIC FUNCTION PANEL
ALT: 41 U/L — AB (ref 10–40)
AST: 30 (ref 14–40)
Alkaline Phosphatase: 111 (ref 25–125)
Bilirubin, Total: 0.6

## 2024-05-01 LAB — CBC AND DIFFERENTIAL
HCT: 51 (ref 41–53)
Hemoglobin: 11.2 — AB (ref 13.5–17.5)
Neutrophils Absolute: 5.2
Platelets: 155 K/uL (ref 150–400)
WBC: 8.7

## 2024-05-01 LAB — BASIC METABOLIC PANEL WITH GFR
BUN: 30 — AB (ref 4–21)
CO2: 17 (ref 13–22)
Chloride: 108 (ref 99–108)
Creatinine: 1.1 (ref 0.6–1.3)
Glucose: 130
Potassium: 3.9 meq/L (ref 3.5–5.1)
Sodium: 149 — AB (ref 137–147)

## 2024-05-01 LAB — CBC: RBC: 5.6 — AB (ref 3.87–5.11)

## 2024-05-02 ENCOUNTER — Telehealth (HOSPITAL_COMMUNITY): Payer: Self-pay | Admitting: Emergency Medicine

## 2024-05-02 ENCOUNTER — Encounter (HOSPITAL_BASED_OUTPATIENT_CLINIC_OR_DEPARTMENT_OTHER): Payer: Self-pay | Admitting: Family

## 2024-05-02 NOTE — Telephone Encounter (Signed)
 Reaching out to patient to offer assistance regarding upcoming cardiac imaging study; pt verbalizes understanding of appt date/time, parking situation and where to check in, pre-test NPO status and medications ordered, and verified current allergies; name and call back number provided for further questions should they arise Rockwell Alexandria RN Navigator Cardiac Imaging [facility] [phone] office [phone] cell

## 2024-05-02 NOTE — Telephone Encounter (Signed)
 Attempted to call patient regarding upcoming cardiac CT appointment. Left message on voicemail with name and callback number Rockwell Alexandria RN Navigator Cardiac Imaging [facility] [phone] Office [phone] Cell

## 2024-05-03 ENCOUNTER — Other Ambulatory Visit: Payer: Self-pay | Admitting: Cardiology

## 2024-05-03 ENCOUNTER — Ambulatory Visit (HOSPITAL_BASED_OUTPATIENT_CLINIC_OR_DEPARTMENT_OTHER): Payer: Self-pay | Admitting: Family

## 2024-05-03 ENCOUNTER — Ambulatory Visit (HOSPITAL_BASED_OUTPATIENT_CLINIC_OR_DEPARTMENT_OTHER)
Admission: RE | Admit: 2024-05-03 | Discharge: 2024-05-03 | Disposition: A | Discharge status: Home / Self Care | Source: Ambulatory Visit | Attending: Cardiology | Admitting: Cardiology

## 2024-05-03 ENCOUNTER — Ambulatory Visit (HOSPITAL_COMMUNITY)
Admission: RE | Admit: 2024-05-03 | Discharge: 2024-05-03 | Disposition: A | Discharge status: Home / Self Care | Source: Ambulatory Visit | Attending: Cardiology | Admitting: Cardiology

## 2024-05-03 ENCOUNTER — Encounter (HOSPITAL_BASED_OUTPATIENT_CLINIC_OR_DEPARTMENT_OTHER): Payer: Self-pay

## 2024-05-03 DIAGNOSIS — R931 Abnormal findings on diagnostic imaging of heart and coronary circulation: Secondary | ICD-10-CM | POA: Diagnosis not present

## 2024-05-03 DIAGNOSIS — I5022 Chronic systolic (congestive) heart failure: Secondary | ICD-10-CM | POA: Insufficient documentation

## 2024-05-03 DIAGNOSIS — R0609 Other forms of dyspnea: Secondary | ICD-10-CM | POA: Diagnosis present

## 2024-05-03 DIAGNOSIS — E785 Hyperlipidemia, unspecified: Secondary | ICD-10-CM | POA: Insufficient documentation

## 2024-05-03 DIAGNOSIS — G4733 Obstructive sleep apnea (adult) (pediatric): Secondary | ICD-10-CM | POA: Insufficient documentation

## 2024-05-03 DIAGNOSIS — I2510 Atherosclerotic heart disease of native coronary artery without angina pectoris: Secondary | ICD-10-CM | POA: Diagnosis not present

## 2024-05-03 DIAGNOSIS — I25118 Atherosclerotic heart disease of native coronary artery with other forms of angina pectoris: Secondary | ICD-10-CM

## 2024-05-03 DIAGNOSIS — I700 Atherosclerosis of aorta: Secondary | ICD-10-CM | POA: Diagnosis not present

## 2024-05-03 DIAGNOSIS — I10 Essential (primary) hypertension: Secondary | ICD-10-CM | POA: Insufficient documentation

## 2024-05-03 DIAGNOSIS — I110 Hypertensive heart disease with heart failure: Secondary | ICD-10-CM | POA: Insufficient documentation

## 2024-05-03 LAB — POCT I-STAT CREATININE: Creatinine, Ser: 0.9 mg/dL (ref 0.61–1.24)

## 2024-05-03 MED ORDER — NITROGLYCERIN 0.4 MG SL SUBL
0.8000 mg | SUBLINGUAL_TABLET | Freq: Once | SUBLINGUAL | Status: AC
  Administered 2024-05-03: 0.8 mg via SUBLINGUAL

## 2024-05-03 MED ORDER — IOHEXOL 350 MG/ML SOLN
100.0000 mL | Freq: Once | INTRAVENOUS | Status: AC | PRN
  Administered 2024-05-03: 100 mL via INTRAVENOUS

## 2024-05-04 ENCOUNTER — Other Ambulatory Visit: Payer: Self-pay | Admitting: Urology

## 2024-05-04 DIAGNOSIS — D49512 Neoplasm of unspecified behavior of left kidney: Secondary | ICD-10-CM

## 2024-05-08 ENCOUNTER — Telehealth: Payer: Self-pay | Admitting: *Deleted

## 2024-05-08 NOTE — Telephone Encounter (Addendum)
 Cardiac Catheterization scheduled at [facility] for: Wednesday May 09, 2024 8:30 AM Arrival time [facility] Main Entrance A at: 6:30 AM  Diet: -Nothing to eat after midnight prior to procedure.  Hydration: -May drink clear liquids until leaving for hospital. Approved liquids: Water , clear tea, black coffee, fruit juices-non-citric and without pulp,Gatorade, plain Jello/popsicles.  Drink 16 oz. bottle of water  on the way to the hospital.   Medication instructions: -Hold:  Metformin -day of procedure and 48 hours post procedure  Hydrochlorothiazide -AM of procedure -Other usual morning medications can be taken including aspirin  81 mg.  Plan to go home the same day, you will only stay overnight if medically necessary.  You must have responsible adult to drive you home.  Someone must be with you the first 24 hours after you arrive home.  Reviewed procedure instructions with patients.

## 2024-05-08 NOTE — Telephone Encounter (Signed)
 Call placed to patient to review instructions, no answer, voicemail.

## 2024-05-08 NOTE — Telephone Encounter (Signed)
I spoke with patient and reviewed results.

## 2024-05-08 NOTE — Telephone Encounter (Signed)
 Patient returned RN's call.

## 2024-05-09 ENCOUNTER — Encounter (HOSPITAL_COMMUNITY): Payer: Self-pay | Admitting: Cardiology

## 2024-05-09 ENCOUNTER — Other Ambulatory Visit: Payer: Self-pay

## 2024-05-09 ENCOUNTER — Ambulatory Visit (HOSPITAL_COMMUNITY)
Admission: RE | Admit: 2024-05-09 | Discharge: 2024-05-09 | Disposition: A | Discharge status: Home / Self Care | Attending: Cardiology | Admitting: Cardiology

## 2024-05-09 ENCOUNTER — Encounter (HOSPITAL_COMMUNITY)
Admission: RE | Disposition: A | Discharge status: Home / Self Care | Payer: Self-pay | Source: Home / Self Care | Attending: Cardiology

## 2024-05-09 DIAGNOSIS — J449 Chronic obstructive pulmonary disease, unspecified: Secondary | ICD-10-CM | POA: Diagnosis not present

## 2024-05-09 DIAGNOSIS — I110 Hypertensive heart disease with heart failure: Secondary | ICD-10-CM | POA: Diagnosis not present

## 2024-05-09 DIAGNOSIS — Z79899 Other long term (current) drug therapy: Secondary | ICD-10-CM | POA: Insufficient documentation

## 2024-05-09 DIAGNOSIS — I5022 Chronic systolic (congestive) heart failure: Secondary | ICD-10-CM | POA: Diagnosis not present

## 2024-05-09 DIAGNOSIS — I25118 Atherosclerotic heart disease of native coronary artery with other forms of angina pectoris: Secondary | ICD-10-CM

## 2024-05-09 DIAGNOSIS — I25119 Atherosclerotic heart disease of native coronary artery with unspecified angina pectoris: Secondary | ICD-10-CM | POA: Diagnosis not present

## 2024-05-09 DIAGNOSIS — G4733 Obstructive sleep apnea (adult) (pediatric): Secondary | ICD-10-CM | POA: Diagnosis not present

## 2024-05-09 DIAGNOSIS — I2582 Chronic total occlusion of coronary artery: Secondary | ICD-10-CM | POA: Insufficient documentation

## 2024-05-09 DIAGNOSIS — I2510 Atherosclerotic heart disease of native coronary artery without angina pectoris: Secondary | ICD-10-CM

## 2024-05-09 DIAGNOSIS — R931 Abnormal findings on diagnostic imaging of heart and coronary circulation: Secondary | ICD-10-CM | POA: Diagnosis present

## 2024-05-09 DIAGNOSIS — E119 Type 2 diabetes mellitus without complications: Secondary | ICD-10-CM | POA: Insufficient documentation

## 2024-05-09 HISTORY — PX: LEFT HEART CATH AND CORONARY ANGIOGRAPHY: CATH118249

## 2024-05-09 LAB — GLUCOSE, CAPILLARY: Glucose-Capillary: 176 mg/dL — ABNORMAL HIGH (ref 70–99)

## 2024-05-09 SURGERY — LEFT HEART CATH AND CORONARY ANGIOGRAPHY
Anesthesia: LOCAL

## 2024-05-09 MED ORDER — IOHEXOL 350 MG/ML SOLN
INTRAVENOUS | Status: DC | PRN
  Administered 2024-05-09: 48 mL via INTRA_ARTERIAL

## 2024-05-09 MED ORDER — ACETAMINOPHEN 325 MG PO TABS: 650.0000 mg | ORAL_TABLET | ORAL | Status: DC | PRN

## 2024-05-09 MED ORDER — FENTANYL CITRATE (PF) 100 MCG/2ML IJ SOLN
INTRAMUSCULAR | Status: DC | PRN
  Administered 2024-05-09: 25 ug via INTRAVENOUS

## 2024-05-09 MED ORDER — ONDANSETRON HCL 4 MG/2ML IJ SOLN: 4.0000 mg | Freq: Four times a day (QID) | INTRAMUSCULAR | Status: DC | PRN

## 2024-05-09 MED ORDER — SODIUM CHLORIDE 0.9% FLUSH: 3.0000 mL | Freq: Two times a day (BID) | INTRAVENOUS | Status: DC

## 2024-05-09 MED ORDER — SODIUM CHLORIDE 0.9% FLUSH: 3.0000 mL | INTRAVENOUS | Status: DC | PRN

## 2024-05-09 MED ORDER — MIDAZOLAM HCL 2 MG/2ML IJ SOLN
INTRAMUSCULAR | Status: AC
  Filled 2024-05-09: qty 2

## 2024-05-09 MED ORDER — FREE WATER: 500.0000 mL | Freq: Once | Status: DC

## 2024-05-09 MED ORDER — FENTANYL CITRATE (PF) 100 MCG/2ML IJ SOLN
INTRAMUSCULAR | Status: AC
Start: 2024-05-09 — End: 2024-05-09
  Filled 2024-05-09: qty 2

## 2024-05-09 MED ORDER — VERAPAMIL HCL 2.5 MG/ML IV SOLN
INTRAVENOUS | Status: AC
  Filled 2024-05-09: qty 2

## 2024-05-09 MED ORDER — LABETALOL HCL 5 MG/ML IV SOLN: 10.0000 mg | INTRAVENOUS | Status: DC | PRN

## 2024-05-09 MED ORDER — SODIUM CHLORIDE 0.9 % IV SOLN: 250.0000 mL | INTRAVENOUS | Status: DC | PRN

## 2024-05-09 MED ORDER — ASPIRIN 81 MG PO CHEW: 81.0000 mg | CHEWABLE_TABLET | ORAL | Status: DC

## 2024-05-09 MED ORDER — VERAPAMIL HCL 2.5 MG/ML IV SOLN
INTRAVENOUS | Status: DC | PRN
  Administered 2024-05-09: 10 mL via INTRA_ARTERIAL

## 2024-05-09 MED ORDER — LIDOCAINE HCL (PF) 1 % IJ SOLN
INTRAMUSCULAR | Status: AC
  Filled 2024-05-09: qty 30

## 2024-05-09 MED ORDER — MIDAZOLAM HCL 2 MG/2ML IJ SOLN
INTRAMUSCULAR | Status: DC | PRN
  Administered 2024-05-09: 1 mg via INTRAVENOUS

## 2024-05-09 MED ORDER — HEPARIN (PORCINE) IN NACL 1000-0.9 UT/500ML-% IV SOLN
INTRAVENOUS | Status: DC | PRN
  Administered 2024-05-09 (×2): 500 mL

## 2024-05-09 MED ORDER — LIDOCAINE HCL (PF) 1 % IJ SOLN
INTRAMUSCULAR | Status: DC | PRN
  Administered 2024-05-09: 2 mL

## 2024-05-09 MED ORDER — HEPARIN SODIUM (PORCINE) 1000 UNIT/ML IJ SOLN
INTRAMUSCULAR | Status: DC | PRN
Start: 2024-05-09 — End: 2024-05-09
  Administered 2024-05-09: 5000 [IU] via INTRAVENOUS

## 2024-05-09 MED ORDER — HEPARIN SODIUM (PORCINE) 1000 UNIT/ML IJ SOLN
INTRAMUSCULAR | Status: AC
  Filled 2024-05-09: qty 10

## 2024-05-09 MED ORDER — HYDRALAZINE HCL 20 MG/ML IJ SOLN: 10.0000 mg | INTRAMUSCULAR | Status: DC | PRN

## 2024-05-09 SURGICAL SUPPLY — 8 items
CATH INFINITI 5FR JL4 (CATHETERS) IMPLANT
CATH INFINITI AMBI 5FR TG (CATHETERS) IMPLANT
DEVICE RAD COMP TR BAND LRG (VASCULAR PRODUCTS) IMPLANT
GLIDESHEATH SLEND SS 6F .021 (SHEATH) IMPLANT
GUIDEWIRE INQWIRE 1.5J.035X260 (WIRE) IMPLANT
PACK CARDIAC CATHETERIZATION (CUSTOM PROCEDURE TRAY) ×1 IMPLANT
SET ATX-X65L (MISCELLANEOUS) IMPLANT
SHEATH PROBE COVER 6X72 (BAG) IMPLANT

## 2024-05-09 NOTE — Interval H&P Note (Signed)
 History and Physical Interval Note:  05/09/2024 8:29 AM  Steven Davies  has presented today for surgery, with the diagnosis of abnormal ct.  The various methods of treatment have been discussed with the patient and family. After consideration of risks, benefits and other options for treatment, the patient has consented to  Procedure(s): LEFT HEART CATH AND CORONARY ANGIOGRAPHY (N/A)  PERCUTANEOUS CORONARY INTERVENTION  as a surgical intervention.  The patient's history has been reviewed, patient examined, no change in status, stable for surgery.  I have reviewed the patient's chart and labs.  Questions were answered to the patient's satisfaction.    Cath Lab Visit (complete for each Cath Lab visit)  Clinical Evaluation Leading to the Procedure:   ACS: Yes.    Non-ACS:    Anginal Classification: CCS I  Anti-ischemic medical therapy: Minimal Therapy (1 class of medications)  Non-Invasive Test Results: High-risk stress test findings: cardiac mortality >3%/year  Prior CABG: No previous CABG    Steven Davies

## 2024-05-09 NOTE — Interval H&P Note (Signed)
 History and Physical Interval Note:  05/09/2024 8:33 AM  Steven Davies  has presented today for surgery, with the diagnosis of abnormal ct ANGIOGRAM.  The various methods of treatment have been discussed with the patient and family. After consideration of risks, benefits and other options for treatment, the patient has consented to  Procedure(s): LEFT HEART CATH AND CORONARY ANGIOGRAPHY (N/A)  PERCUTANEOUS CORONARY INTERVENTION  as a surgical intervention.  The patient's history has been reviewed, patient examined, no change in status, stable for surgery.  I have reviewed the patient's chart and labs.  Questions were answered to the patient's satisfaction.    Cath Lab Visit (complete for each Cath Lab visit)  Clinical Evaluation Leading to the Procedure:   ACS: No.  Non-ACS:    Anginal Classification: CCS I  Anti-ischemic medical therapy: Minimal Therapy (1 class of medications)  Non-Invasive Test Results: High-risk stress test findings: cardiac mortality >3%/year  Prior CABG: No previous CABG   Steven Davies

## 2024-05-09 NOTE — Discharge Instructions (Signed)
 Radial Site Care The following information offers guidance on how to care for yourself after your procedure. Your health care provider may also give you more specific instructions. If you have problems or questions, contact your health care provider. What can I expect after the procedure? After the procedure, it is common to have bruising and tenderness in the incision area. Follow these instructions at home: Incision site care  Follow instructions from your health care provider about how to take care of your incision site. Make sure you: Wash your hands with soap and water for at least 20 seconds before and after you change your bandage (dressing). If soap and water are not available, use hand sanitizer. Remove your dressing in 24 hours. Leave stitches (sutures), skin glue, or adhesive strips in place. These skin closures may need to stay in place for 2 weeks or longer. If adhesive strip edges start to loosen and curl up, you may trim the loose edges. Do not remove adhesive strips completely unless your health care provider tells you to do that. Do not take baths, swim, or use a hot tub for at least 1 week. You may shower 24 hours after the procedure or as told by your health care provider. Remove the dressing and gently wash the incision area with plain soap and water. Pat the area dry with a clean towel. Do not rub the site. That could cause bleeding. Do not apply powder or lotion to the site. Check your incision site every day for signs of infection. Check for: Redness, swelling, or pain. Fluid or blood. Warmth. Pus or a bad smell. Activity For 24 hours after the procedure, or as directed by your health care provider: Do not flex or bend the affected arm. Do not push or pull heavy objects with the affected arm. Do not operate machinery or power tools. Do not drive. You should not drive yourself home from the hospital or clinic if you go home during that time period. You may drive 24  hours after the procedure unless your health care provider tells you not to. Do not lift anything that is heavier than 10 lb (4.5 kg), or the limit that you are told, until your health care provider says that it is safe. Return to your normal activities as told by your health care provider. Ask your health care provider what activities are safe for you and when you can return to work. If you were given a sedative during the procedure, it can affect you for several hours. Do not drive or operate machinery until your health care provider says that it is safe. General instructions Take over-the-counter and prescription medicines only as told by your health care provider. If you will be going home right after the procedure, plan to have a responsible adult care for you for the time you are told. This is important. Keep all follow-up visits. This is important. Contact a health care provider if: You have a fever or chills. You have any of these signs of infection at your incision site: Redness, swelling, or pain. Fluid or blood. Warmth. Pus or a bad smell. Get help right away if: The incision area swells very fast. The incision area is bleeding, and the bleeding does not stop when you hold steady pressure on the area. Your arm or hand becomes pale, cool, tingly, or numb. These symptoms may represent a serious problem that is an emergency. Do not wait to see if the symptoms will go away. Get medical  help right away. Call your local emergency services (911 in the [place] Do not drive yourself to the hospital. Summary After the procedure, it is common to have bruising and tenderness at the incision site. Follow instructions from your health care provider about how to take care of your radial site incision. Check the incision every day for signs of infection. Do not lift anything that is heavier than 10 lb (4.5 kg), or the limit that you are told, until your health care provider says that it is  safe. Get help right away if the incision area swells very fast, you have bleeding at the incision site that will not stop, or your arm or hand becomes pale, cool, or numb. This information is not intended to replace advice given to you by your health care provider. Make sure you discuss any questions you have with your health care provider. Document Revised: 11/09/2020 Document Reviewed: 11/09/2020 Elsevier Patient Education  2024 [employer].

## 2024-06-07 ENCOUNTER — Ambulatory Visit (HOSPITAL_BASED_OUTPATIENT_CLINIC_OR_DEPARTMENT_OTHER): Admitting: Pulmonary Disease

## 2024-06-14 NOTE — Progress Notes (Unsigned)
 Cardiology Office Note   Date:  06/15/2024  ID:  Steven, Davies 01-09-1941, MRN [id number] PCP: Charlanne Fredia CROME, MD  [facility] Providers Cardiologist:  Shelda Bruckner, MD     History of Present Illness  Discussed the use of AI scribe software for clinical note transcription with the patient, who gave verbal consent to proceed.  History of Present Illness Steven Davies Art is an 83 year old male with hx of HTN, COPD, OSA, DM2, HFrEF.   Echocardiogram 04/2024 LVEF 45-50%, LV global hypokinesis, gr1dd, RV normal, borderline dilation ascending aorta 39mm and aortic root 38mm. Previously reduced amlodipine  from 10mg  to 5mg  due to ankle swelling. A nuclear stress test was conducted prior to 2006, and CT scans for lung nodules have shown incidental plaque buildup in heart arteries.   At visit 04/27/24. Breathing improved since transition to Trelegy. He was exercising in the water  twice per week. He was wearing CPAP regularly. Alcohol 3-4 times per week total less than 10 oz per week. He was restricting to <64 oz fluid per day. Due to HFrEF and coronary calcification on CT, cardiac CTA pursued. Losartan  was transitioned to Entresto . CCTA 04/2024 with severe stenosis in LAD and LCx calcium  score of 1298 placing him in 91st percentile. LHC 05/09/24 severe two vessel disease with CTO of prox LAD and ostial AV groove with collaterals. Recommended for medical management. RHC with no volume overload.   Presents today for follow up independently. More recent issues with back, trialed Meloxicam   and does improve with exercise/stretching. Continues to exercise twice per week in the pool. Notes he does get low oxygen levels with ambulation but improve quickly to 95% after resting a few moments. No LE edema. No exertional dyspnea, chest pain. R radial cath site healed without issue. Cardiac cath, lipid panel, echo reviewed.    ROS: Please see the history of present illness.    All other  systems reviewed and are negative.   Studies Reviewed      Cardiac Studies & Procedures   ______________________________________________________________________________________________ CARDIAC CATHETERIZATION  CARDIAC CATHETERIZATION 05/09/2024  Conclusion Images from the original result were not included.    LPAV lesion is 100% stenosed.  This vessel fills via an atrial to the AV groove branch   Prox LAD to Mid LAD lesion is 100% stenosed.  Fills via septal collaterals and distal PL to distal LAD collaterals.   LV end diastolic pressure is low.   There is no aortic valve stenosis.  Dominance: Right   Severe two-vessel disease with CTO of proximal LAD and ostial AV groove LCx with both vessels being completely fills via collaterals consistent with Coronary CTA findings LVEDP of 2 indicating adequate diuresis and no evidence of heart failure.   RECOMMENDATIONS Recommend aggressive risk factor modification and medical management of angina Discharge home after bedrest and follow-up with primary cardiologist.    [name], MD  Findings Coronary Findings Diagnostic  Dominance: Right  Left Main Vessel was injected. Vessel is large.  Left Anterior Descending Collaterals Dist LAD filled by collaterals from 1st RPL.  Prox LAD to Mid LAD lesion is 100% stenosed.  First Septal Branch Vessel is small in size.  Second Septal Branch Collaterals 2nd Sept filled by collaterals from Inf Sept.  Third Diagonal Branch Vessel is small in size.  Third Septal Branch Vessel is small in size.  Left Circumflex Vessel is large. The main LCx courses as a lateral OM 2  First Obtuse Marginal Branch  Vessel is small in size.  Second Obtuse Marginal Branch Vessel is small in size.  Third Obtuse Marginal Branch Vessel is small in size.  First Left Posterolateral Branch Vessel is small in size.  Second Left Posterolateral Branch Vessel is large in size.  Third Left  Posterolateral Branch Vessel is small in size.  Left Posterior Atrioventricular Artery Collaterals LPAV filled by collaterals from 2nd Mrg.  LPAV lesion is 100% stenosed.  Right Coronary Artery Vessel was injected. Vessel is large. Very large Vessel is angiographically normal.  Right Ventricular Branch Vessel is small in size.  Right Posterior Descending Artery Collaterals RPDA filled by collaterals from 3rd Sept.  Right Posterior Atrioventricular Artery Vessel is large in size.  First Right Posterolateral Branch Vessel is large in size.  Intervention  No interventions have been documented.     ECHOCARDIOGRAM  ECHOCARDIOGRAM COMPLETE 04/09/2024  Narrative ECHOCARDIOGRAM REPORT    Patient Name:   Steven Davies [facility] Date of Exam: 04/09/2024 Medical Rec #:  [id number]       Height:       67.5 in Accession #:    [id number]      Weight:       210.4 lb Date of Birth:  June 01, 1941      BSA:          2.078 m Patient Age:    82 years        BP:           110/60 mmHg Patient Gender: M               HR:           76 bpm. Exam Location:  Outpatient  Procedure: 2D Echo, 3D Echo, Cardiac Doppler, Color Doppler and Strain Analysis (Both Spectral and Color Flow Doppler were utilized during procedure).  Indications:    Aortic Dilatation  History:        Patient has prior history of Echocardiogram examinations, most recent 04/19/2023. COPD; Risk Factors:Former Smoker, Diabetes, Hypertension and Dyslipidemia.  Sonographer:    Orvil Holmes RDCS Referring Phys: [id number] Slayton Lubitz S Benoit Meech  IMPRESSIONS   1. Left ventricular ejection fraction, by estimation, is 45 to 50%. Left ventricular ejection fraction by 3D volume is 48 %. The left ventricle has mildly decreased function. The left ventricle demonstrates global hypokinesis. Left ventricular diastolic parameters are consistent with Grade I diastolic dysfunction (impaired relaxation). The average left ventricular global  longitudinal strain is -14.0 %. The global longitudinal strain is abnormal. 2. Right ventricular systolic function is normal. The right ventricular size is normal. Tricuspid regurgitation signal is inadequate for assessing PA pressure. 3. The mitral valve is grossly normal. No evidence of mitral valve regurgitation. 4. The aortic valve is tricuspid. Aortic valve regurgitation is not visualized. 5. Aortic dilatation noted. There is borderline dilatation of the ascending aorta, measuring 39 mm. There is borderline dilatation of the aortic root, measuring 38 mm. 6. The inferior vena cava is normal in size with greater than 50% respiratory variability, suggesting right atrial pressure of 3 mmHg.  Comparison(s): Changes from prior study are noted. 04/19/2023: LVEF 55-60%, aortic root 39 mm.  FINDINGS Left Ventricle: Left ventricular ejection fraction, by estimation, is 45 to 50%. Left ventricular ejection fraction by 3D volume is 48 %. The left ventricle has mildly decreased function. The left ventricle demonstrates global hypokinesis. The average left ventricular global longitudinal strain is -14.0 %. Strain was performed and the global longitudinal strain is abnormal. The left ventricular  internal cavity size was normal in size. There is no left ventricular hypertrophy. Left ventricular diastolic parameters are consistent with Grade I diastolic dysfunction (impaired relaxation). Indeterminate filling pressures.  Right Ventricle: The right ventricular size is normal. No increase in right ventricular wall thickness. Right ventricular systolic function is normal. Tricuspid regurgitation signal is inadequate for assessing PA pressure.  Left Atrium: Left atrial size was normal in size.  Right Atrium: Right atrial size was normal in size.  Pericardium: There is no evidence of pericardial effusion.  Mitral Valve: The mitral valve is grossly normal. No evidence of mitral valve regurgitation.  Tricuspid  Valve: The tricuspid valve is grossly normal. Tricuspid valve regurgitation is trivial.  Aortic Valve: The aortic valve is tricuspid. Aortic valve regurgitation is not visualized.  Pulmonic Valve: The pulmonic valve was normal in structure. Pulmonic valve regurgitation is not visualized.  Aorta: Aortic dilatation noted. There is borderline dilatation of the ascending aorta, measuring 39 mm. There is borderline dilatation of the aortic root, measuring 38 mm.  Venous: The inferior vena cava is normal in size with greater than 50% respiratory variability, suggesting right atrial pressure of 3 mmHg.  IAS/Shunts: No atrial level shunt detected by color flow Doppler.  Additional Comments: 3D was performed not requiring image post processing on an independent workstation and was abnormal.   LEFT VENTRICLE PLAX 2D LVIDd:         5.03 cm         Diastology LVIDs:         3.26 cm         LV e' medial:    4.68 cm/s LV PW:         1.14 cm         LV E/e' medial:  11.9 LV IVS:        0.93 cm         LV e' lateral:   7.62 cm/s LVOT diam:     2.20 cm         LV E/e' lateral: 7.3 LV SV:         62 LV SV Index:   30              2D Longitudinal LVOT Area:     3.80 cm        Strain 2D Strain GLS   -15.0 % (A4C): LV Volumes (MOD)               2D Strain GLS   -15.0 % LV vol d, MOD    149.1 ml      (A3C): A2C:                           2D Strain GLS   -12.1 % LV vol d, MOD    142.8 ml      (A2C): A4C:                           2D Strain GLS   -14.0 % LV vol s, MOD    79.0 ml       Avg: A2C: LV vol s, MOD    79.0 ml       3D Volume EF A4C:                           LV 3D EF:    Left LV  SV MOD A2C:   70.1 ml                    ventricul LV SV MOD A4C:   142.8 ml                   ar LV SV MOD BP:    64.8 ml                    ejection fraction by 3D volume is 48 %.  3D Volume EF: 3D EF:        48 % LV EDV:       159 ml LV ESV:       83 ml LV SV:        77 ml  RIGHT VENTRICLE RV S prime:      20.80 cm/s TAPSE (M-mode): 2.2 cm  LEFT ATRIUM             Index        RIGHT ATRIUM           Index LA diam:        4.30 cm 2.07 cm/m   RA Area:     19.40 cm LA Vol (A2C):   56.5 ml 27.19 ml/m  RA Volume:   49.40 ml  23.78 ml/m LA Vol (A4C):   60.2 ml 28.97 ml/m LA Biplane Vol: 58.3 ml 28.06 ml/m AORTIC VALVE LVOT Vmax:   78.60 cm/s LVOT Vmean:  51.900 cm/s LVOT VTI:    0.164 m  AORTA Ao Root diam: 3.80 cm Ao Asc diam:  3.75 cm  MITRAL VALVE MV Area (PHT): 3.60 cm    SHUNTS MV Decel Time: 211 msec    Systemic VTI:  0.16 m MV E velocity: 55.50 cm/s  Systemic Diam: 2.20 cm MV A velocity: 82.30 cm/s MV E/A ratio:  0.67  Vinie Maxcy MD Electronically signed by Vinie Maxcy MD Signature Date/Time: 04/09/2024/1:49:17 PM    Final      CT SCANS  CT CORONARY MORPH W/CTA COR W/SCORE 05/03/2024  Addendum 05/04/2024  5:08 AM ADDENDUM REPORT: 05/04/2024 05:06  EXAM: OVER-READ INTERPRETATION  CT CHEST  The following report is an over-read performed by radiologist Dr. Suzen Dials of [facility] on 05/04/2024. This over-read does not include interpretation of cardiac or coronary anatomy or pathology. The coronary calcium  score/coronary CTA interpretation by the cardiologist is attached.  COMPARISON:  February 01, 2024  FINDINGS: Cardiovascular: There are no significant extracardiac vascular findings.  Mediastinum/Nodes: There are no enlarged lymph nodes within the visualized mediastinum.  Lungs/Pleura: There is no pleural effusion. Mild lingular, right middle lobe and bibasilar linear scarring and/or atelectasis is seen.  A predominantly stable 3.0 cm x 2.0 cm x 3.2 cm lung mass is seen within the posterior aspect of the left lung base (approximately 19.91 Hounsfield units).  Upper abdomen: No significant findings in the visualized upper abdomen.  Musculoskeletal/Chest wall: No chest wall mass or suspicious osseous findings within the  visualized chest.  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   Electronically Signed By: Suzen Dials M.D. 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 Calcium  scoring was performed using the  Agatston method. A 120 kV prospective, gated, contrast cardiac CT scan was obtained. Gantry rotation speed was 230 msec and collimation was 0.63 mm. Two sublingual nitroglycerin  tablets (0.8 mg) were given. The 3D data set was reconstructed with motion correction for the best systolic or diastolic phase. Images were analyzed on a dedicated workstation using MPR, MIP, and VRT modes. The patient received 95 cc of contrast.  FINDINGS: Image quality: Excellent.  Noise artifact is: Limited.  Coronary Arteries:  Normal coronary origin.  Right dominance.  Left main: The left main is a large caliber vessel with a normal take off from the left coronary cusp that bifurcates to form a left anterior descending artery and a left circumflex artery. There is minimal (0-24) plaque in the distal vessel.  Left anterior descending artery: The LAD has severe (70-99) mixed plaque stenosis in the proximal vessel. The LAD gives off 2 diagonal branches; mild (25-49) stenosis in ostial D2.  Left circumflex artery: The LCX is non-dominant with severe (70-99) stenosis after takeoff of OM1. The LCX gives off 3 obtuse marginal branches; minimal (0-24) plaque noted in ostial OM1.  Right coronary artery: The RCA is dominant with normal take off from the right coronary cusp. There is minimal (0-24) plaque in the proximal vessel. The RCA gives off RV branch and terminates as a PDA and right posterolateral branch without evidence  of plaque or stenosis.  Right Atrium: Right atrial size is within normal limits.  Right Ventricle: The right ventricular cavity is within normal limits.  Left Atrium: Left atrial size is normal in size with no left atrial appendage filling defect.  Left Ventricle: The ventricular cavity size is within normal limits.  Pulmonary arteries: Dilated (3.8 cm).  Pulmonary veins: Normal pulmonary venous drainage.  Pericardium: Normal thickness without significant effusion or calcium  present.  Cardiac valves: The aortic valve is trileaflet without significant calcification. The mitral valve is normal without significant calcification.  Aorta: Normal caliber with aortic atherosclerosis.  Extra-cardiac findings: See attached radiology report for non-cardiac structures.  IMPRESSION: 1. Coronary calcium  score of 1298. This was 59 percentile for age-, sex, and race-matched controls.  2. Total plaque volume 909 mm3 which is 62 percentile for age- and sex-matched controls (calcified plaque 177 mm3; non-calcified plaque 732 mm3). TPV is (extensive).  3. Normal coronary origin with right dominance.  4. Severe (70-99) stenosis in the LAD and Lcx.  5. Aortic atherosclerosis.  6. 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  7. Study will be sent for FFR.  RECOMMENDATIONS: CAD-RADS 4: Severe stenosis. (70-99% or > 50% left main). Cardiac catheterization or CT FFR is recommended. Consider symptom-guided anti-ischemic pharmacotherapy as well as risk factor modification per guideline directed care. Invasive coronary angiography recommended with revascularization per published guideline statements.  Redell Shallow, MD  Electronically Signed: By: Redell Shallow M.D. On: 05/03/2024 13:22     ______________________________________________________________________________________________         Risk Assessment/Calculations          Physical  Exam VS:  BP 130/78 (BP Location: Left Arm, Patient Position: Sitting, Cuff Size: Normal)   Pulse 94   Ht 5' 9 (1.753 m)   Wt 205 lb 4.8 oz (93.1 kg)   SpO2 (!) 89%   BMI 30.32 kg/m        Wt Readings from Last 3 Encounters:  06/15/24 205 lb 4.8 oz (93.1 kg)  05/09/24 207 lb (93.9 kg)  04/27/24 205 lb 11.2 oz (93.3 kg)  GEN: Well nourished, well developed in no acute distress NECK: No JVD; No carotid bruits CARDIAC: RRR, no murmurs, rubs, gallops RESPIRATORY:  Clear to auscultation without rales, wheezing or rhonchi  ABDOMEN: Soft, non-tender, non-distended EXTREMITIES:  No edema; No deformity   ASSESSMENT AND PLAN  Assessment & Plan HFmrEF Ejection fraction decreased from 55-60% to 45-50% by echo 04/2024. Euvolemic and well compensated on exam. NYHA I. GDMT Entresto  97-103mg  BID. As he feels overall well, politely defers addition of MRA or SGLT2i. Could be considered in the future.   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 No indication for ischemic evaluation. Recent LHC with CTO with collaterals recommended for medical management.  R radial cath site appropriately healed. GDMT Aspirin  81mg  daily.  -Increase Atorvastatin  to 40mg  daily.  -Fasting lipid panel, CMET, direct LDL with upcoming PCP labs.  -If LDL not at goal, plan to further increase Atorvastatin  to 80mg . If triglycerides not at goal, consider Vascepa. Handout provided on triglyceride lowering lifestyle changes.   Hypertension BP well controlled. Continue current antihypertensive regimen amlodipine  5 mg daily, hydrochlorothiazide  12.5 mg daily, Entresto  97-103 mg twice daily.  Chronic obstructive pulmonary disease (COPD) Improved symptoms with transition from Breo Ellipta  to Trelegy. Followed by pulmonology.   Obstructive sleep apnea Managed with CPAP and oxygen concentrator. Followed by pulmonology.   Type 2 diabetes mellitus Continue to follow with PCP.  If  additional agent needed in the future consider SGLT2 or GLP-1 for cardioprotective benefit.         Dispo: follow up in 6 months  Signed, Reche GORMAN Finder, NP

## 2024-06-15 ENCOUNTER — Encounter (HOSPITAL_BASED_OUTPATIENT_CLINIC_OR_DEPARTMENT_OTHER): Payer: Self-pay | Admitting: Family

## 2024-06-15 ENCOUNTER — Ambulatory Visit (INDEPENDENT_AMBULATORY_CARE_PROVIDER_SITE_OTHER): Admitting: Family

## 2024-06-15 VITALS — BP 130/78 | HR 94 | Ht 69.0 in | Wt 205.3 lb

## 2024-06-15 DIAGNOSIS — I5022 Chronic systolic (congestive) heart failure: Secondary | ICD-10-CM

## 2024-06-15 DIAGNOSIS — E781 Pure hyperglyceridemia: Secondary | ICD-10-CM

## 2024-06-15 DIAGNOSIS — I25118 Atherosclerotic heart disease of native coronary artery with other forms of angina pectoris: Secondary | ICD-10-CM

## 2024-06-15 DIAGNOSIS — E785 Hyperlipidemia, unspecified: Secondary | ICD-10-CM

## 2024-06-15 MED ORDER — ATORVASTATIN CALCIUM 40 MG PO TABS: 40.0000 mg | ORAL_TABLET | Freq: Every day | ORAL | 3 refills | Status: AC

## 2024-06-15 MED ORDER — ASPIRIN 81 MG PO TBEC: 81.0000 mg | DELAYED_RELEASE_TABLET | Freq: Every day | ORAL | Status: DC

## 2024-06-15 NOTE — Patient Instructions (Signed)
 Medication Instructions:    INCREASE Atorvastatin  one (1) tablet by mouth ( 40 mg) daily.   *If you need a refill on your cardiac medications before your next appointment, please call your pharmacy*  Lab Work:  Your PCP will do your labs at next follow up.  If you have labs (blood work) drawn today and your tests are completely normal, you will receive your results only by: MyChart Message (if you have MyChart) OR A paper copy in the mail If you have any lab test that is abnormal or we need to change your treatment, we will call you to review the results.  Testing/Procedures:   None ordered.  Follow-Up: At [facility], you and your health needs are our priority.  As part of our continuing mission to provide you with exceptional heart care, our providers are all part of one team.  This team includes your primary Cardiologist (physician) and Advanced Practice Providers or APPs (Physician Assistants and Nurse Practitioners) who all work together to provide you with the care you need, when you need it.  Your next appointment:   6 month(s)  Provider:   Shelda Bruckner, MD or Reche Finder, NP    We recommend signing up for the patient portal called MyChart.  Sign up information is provided on this After Visit Summary.  MyChart is used to connect with patients for Virtual Visits (Telemedicine).  Patients are able to view lab/test results, encounter notes, upcoming appointments, etc.  Non-urgent messages can be sent to your provider as well.   To learn more about what you can do with MyChart, go to [email or web address].   Other Instructions  Your physician wants you to follow-up in: 6 months.  You will receive a reminder letter in the mail two months in advance. If you don't receive a letter, please call our office to schedule the follow-up appointment.

## 2024-06-19 ENCOUNTER — Other Ambulatory Visit

## 2024-06-19 LAB — CBC AND DIFFERENTIAL
HCT: 52 (ref 41–53)
Hemoglobin: 17.2 (ref 13.5–17.5)
Platelets: 149 K/uL — AB (ref 150–400)
WBC: 8.6

## 2024-06-19 LAB — COMPREHENSIVE METABOLIC PANEL WITH GFR
Albumin: 4.2 (ref 3.5–5.0)
Calcium: 10 (ref 8.7–10.7)
Globulin: 2.3

## 2024-06-19 LAB — BASIC METABOLIC PANEL WITH GFR
BUN: 21 (ref 4–21)
CO2: 24 — AB (ref 13–22)
Chloride: 108 (ref 99–108)
Creatinine: 0.9 (ref 0.6–1.3)
Glucose: 126
Potassium: 3.9 meq/L (ref 3.5–5.1)
Sodium: 147 (ref 137–147)

## 2024-06-19 LAB — LIPID PANEL
Cholesterol: 116 (ref 0–200)
HDL: 29 — AB (ref 35–70)
LDL Cholesterol: 24
Triglycerides: 315 — AB (ref 40–160)

## 2024-06-19 LAB — TSH: TSH: 2.55 (ref 0.41–5.90)

## 2024-06-19 LAB — HEPATIC FUNCTION PANEL
ALT: 35 U/L (ref 10–40)
AST: 23 (ref 14–40)
Alkaline Phosphatase: 102 (ref 25–125)

## 2024-06-19 LAB — CBC: RBC: 5.69 — AB (ref 3.87–5.11)

## 2024-06-27 ENCOUNTER — Encounter: Payer: Self-pay | Admitting: Internal Medicine

## 2024-07-05 ENCOUNTER — Inpatient Hospital Stay
Admission: RE | Admit: 2024-07-05 | Discharge: 2024-07-05 | Disposition: A | Source: Ambulatory Visit | Attending: Urology | Admitting: Urology

## 2024-07-05 DIAGNOSIS — D49512 Neoplasm of unspecified behavior of left kidney: Secondary | ICD-10-CM

## 2024-07-05 MED ORDER — GADOPICLENOL 0.5 MMOL/ML IV SOLN
9.0000 mL | Freq: Once | INTRAVENOUS | Status: AC | PRN
  Administered 2024-07-05: 9 mL via INTRAVENOUS

## 2024-07-10 LAB — LAB REPORT - SCANNED: PSA, Total: 3.91

## 2024-07-20 ENCOUNTER — Ambulatory Visit (INDEPENDENT_AMBULATORY_CARE_PROVIDER_SITE_OTHER): Admitting: Pulmonary Disease

## 2024-07-20 ENCOUNTER — Encounter (HOSPITAL_BASED_OUTPATIENT_CLINIC_OR_DEPARTMENT_OTHER): Payer: Self-pay | Admitting: Pulmonary Disease

## 2024-07-20 VITALS — BP 135/71 | HR 78 | Ht 69.0 in | Wt 209.8 lb

## 2024-07-20 DIAGNOSIS — J4489 Other specified chronic obstructive pulmonary disease: Secondary | ICD-10-CM | POA: Diagnosis not present

## 2024-07-20 DIAGNOSIS — G4733 Obstructive sleep apnea (adult) (pediatric): Secondary | ICD-10-CM | POA: Diagnosis not present

## 2024-07-20 DIAGNOSIS — R911 Solitary pulmonary nodule: Secondary | ICD-10-CM | POA: Diagnosis not present

## 2024-07-20 NOTE — Progress Notes (Signed)
 Subjective:    Patient ID: Steven Davies, male    DOB: 10-21-1940, 83 y.o.   MRN: [id number]   83 y.o. male former smoker with COPD and obstructive sleep apnea, nocturnal hypoxia Pt of Dr Shellia -on CPAP + 3L  He has a 88 pack-year smoking history.   Discussed the use of AI scribe software for clinical note transcription with the patient, who gave verbal consent to proceed.  History of Present Illness Steven Davies Art is an 83 year old male with COPD who presents for a six-month follow-up.  He experiences no shortness of breath and reports that his lungs feel fine. He uses a CPAP machine with an oxygen blender intake at night, set at four liters. Blood oxygen levels start in the 80s upon waking and improve to 92 after deep breathing exercises. He uses Trelegy daily, which has been beneficial since switching from Breo Ellipta  over a year ago.  A left lower lobe lung nodule, first discovered in 2008, has grown from 1.5 cm to 2.2 by 3 cm as of a recent MRI. He has not experienced any symptoms related to the nodule.  He engages in water  exercises twice a week, focusing on cardio, but experiences limitations due to back pain and knee issues. He is able to walk through the supermarket without breathing issues but does not engage in more strenuous activities.     Significant tests/ events reviewed   PFT 10/13/20 >> FEV1 1.90 (67%), FEV1% 56, TLC 5.87 (87%), DLCO 54% PFT 09/21/22 >> FEV1 1.83 (70%), FEV1% 64, TLC 5.67 (86%), DLCO 64% PFT 11/2023 >> moderate reversible obstruction, FEV1 62% improved to 72% postbronchodilator   Chest Imaging:  CT chest 07/26/18 >> LLL nodule was 14 mm in 2010 and now 2.5 mm, mild centrilobular emphysema CT chest 04/10/21 >> 2.8 x 2.0 x 2.5 cm Lt lung base hamartoma, fatty liver, atherosclerosis CT Chest 06/2023 hamartoma, minimally enlarged from prior exam  Ct chest 02/2024 >> stable compared with 06/18/2023 but larger by about 2 mm compared with 04/09/2021  ,Presence of fat within  the lesion again suggests a benign tumor  Sleep Tests:  HST 05/06/21 >> AHI 13.4, SpO2 low 75%.  Spent 235.6 min with SpO2 < 89%. ONO on CPAP/3 L 06/2023 >> T saturation more than an hour less than 88%, increased to 4 L ONO with oral appliance 08/11/22 >> test time 6 hrs 39 min. Baseline SpO2 84%, low SpO2 74%. Spent 6 hrs 35 min with an SpO2 < 88%  ONO with 5 liters and oral appliance 12/29/22 >> test time 9 hrs 40 sec.  Baseline SpO2 91%, low SpO2 79%.  Spent 23 min 32 sec with SpO2 < 88%.  Review of Systems  neg for any significant sore throat, dysphagia, itching, sneezing, nasal congestion or excess/ purulent secretions, fever, chills, sweats, unintended wt loss, pleuritic or exertional cp, hempoptysis, orthopnea pnd or change in chronic leg swelling. Also denies presyncope, palpitations, heartburn, abdominal pain, nausea, vomiting, diarrhea or change in bowel or urinary habits, dysuria,hematuria, rash, arthralgias, visual complaints, headache, numbness weakness or ataxia.      Objective:   Physical Exam  Gen. 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 Oxygen levels start in the 80s in the morning and improve to 92 with deep breathing. Uses CPAP with oxygen blender intake at night. No dyspnea reported. Trelegy effective in symptom management. Oxygen backup cylinder available at home. Risk of increased  oxygen requirement if he contracts a cold. - Continue Trelegy daily - Use CPAP with oxygen blender intake at night - Monitor oxygen levels, especially during winter - Use backup oxygen cylinder if needed - Repeat scan in August or October next year  Obstructive sleep apnea on CPAP therapy Obstructive sleep apnea well-controlled with CPAP therapy. CPAP report shows good compliance with seven centimeters pressure and minimal events (0.4). Good mask seal and no significant leaks. No need for repeat sleep study unless significant changes occur. 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 Initially discovered in 2008, with slight growth noted in 2022. MRI reports no significant change. Previous PET scan did not indicate malignancy. Potential for future discomfort if it impinges on lung surface, but currently asymptomatic. - Continue monitoring with repeat scan in August or October next year

## 2024-07-20 NOTE — Patient Instructions (Signed)
 Continue trelegy  Continue CPAP + oxygen  Repeat scan in Aug 2026

## 2024-07-24 ENCOUNTER — Non-Acute Institutional Stay: Admitting: Internal Medicine

## 2024-07-24 ENCOUNTER — Encounter: Payer: Self-pay | Admitting: Internal Medicine

## 2024-07-24 VITALS — BP 120/98 | HR 62 | Temp 97.8°F | Ht 69.0 in | Wt 205.8 lb

## 2024-07-24 DIAGNOSIS — J449 Chronic obstructive pulmonary disease, unspecified: Secondary | ICD-10-CM

## 2024-07-24 DIAGNOSIS — G4733 Obstructive sleep apnea (adult) (pediatric): Secondary | ICD-10-CM

## 2024-07-24 DIAGNOSIS — I10 Essential (primary) hypertension: Secondary | ICD-10-CM | POA: Diagnosis not present

## 2024-07-24 DIAGNOSIS — E7800 Pure hypercholesterolemia, unspecified: Secondary | ICD-10-CM | POA: Diagnosis not present

## 2024-07-24 DIAGNOSIS — E1165 Type 2 diabetes mellitus with hyperglycemia: Secondary | ICD-10-CM | POA: Diagnosis not present

## 2024-07-24 DIAGNOSIS — M545 Low back pain, unspecified: Secondary | ICD-10-CM

## 2024-07-24 DIAGNOSIS — G8929 Other chronic pain: Secondary | ICD-10-CM

## 2024-07-25 NOTE — Progress Notes (Signed)
 Location:  [facility] features editor of Service:  Clinic (12)  Provider:   Code Status:  Goals of Care:     05/09/2024    6:57 AM  Advanced Directives  Does Patient Have a Medical Advance Directive? Yes  Type of Estate agent of [employer];Living will  Does patient want to make changes to medical advance directive? No - Patient declined  Copy of Healthcare Power of Attorney in Chart? No - copy requested     Chief Complaint  Patient presents with   Follow-up    3 month follow up Patient would like discuss black pain.    HPI: Patient is a 83 y.o. male seen today for medical management of chronic diseases.   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 Follows with Urology    Discussed the use of AI scribe software for clinical note transcription with the patient, who gave verbal consent to proceed.  History of Present Illness   Steven Davies Steven Davies is an 83 year old male with coronary artery disease and COPD who presents for follow-up of his cardiac and respiratory conditions.  CAD Recent Had Cardiac Cath due to decrease in EF  He has coronary artery disease with two blockages identified on heart catheterization. No intervention was performed due to adequate collateral circulation.   His medication was changed from losartan  to Entresto  two months ago,  his ejection fraction decreased from 55-60% to 45-50% on the last echocardiogram.  COPD He manages COPD with Trelegy, which effectively prevents dyspnea during activities such as walking and climbing stairs.    He uses Oxygen during sleeping  particularly noting hypoxemia upon waking, which improves with deep breathing. No significant dyspnea during physical activity is present.  Diabetes He manages diabetes with metformin , with a recent A1c of 7.7. He has experienced softer and looser stools and a recent weight loss of approximately five pounds.  Back Pain He experiences lower back, buttock, and leg pain, which has been severe but recently improved. He has tried Advil and meloxicam  with limited relief.   He quit smoking 16 years ago.       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   Prediabetes 01/26/2021    Past Surgical History:  Procedure Laterality Date   CHOLECYSTECTOMY     LEFT HEART CATH AND CORONARY ANGIOGRAPHY N/A 05/09/2024   Procedure: LEFT HEART CATH AND CORONARY ANGIOGRAPHY;  Surgeon: Anner Alm ORN, MD;  Location: [facility] INVASIVE CV LAB;  Service: Cardiovascular;  Laterality: N/A;   TONSILLECTOMY      Allergies  Allergen Reactions   Sulfa Antibiotics Other (See Comments)    childhood reaction    Outpatient Encounter Medications as of 07/24/2024  Medication Sig   atorvastatin  (LIPITOR) 40 MG tablet Take 1 tablet (40 mg total) by mouth daily.   FIBER PO Take  1 Dose by mouth daily.   finasteride  (PROSCAR ) 5 MG tablet TAKE 1 TABLET BY MOUTH EVERY OTHER DAY   hydrochlorothiazide  (MICROZIDE ) 12.5 MG capsule TAKE 1 CAPSULE BY MOUTH EVERY DAY   metFORMIN  (GLUCOPHAGE ) 500 MG tablet TAKE 1 TABLET BY MOUTH TWICE A DAY WITH FOOD   Multiple Vitamins-Minerals (PRESERVISION AREDS 2) CAPS Take 1 capsule by mouth 2 (two) times daily.   OXYGEN Inhale 4 L into the lungs at bedtime.   sacubitril -valsartan  (ENTRESTO ) 97-103 MG Take 1 tablet by mouth 2 (two) times daily.   tamsulosin  (FLOMAX ) 0.4 MG CAPS capsule TAKE 1  CAPSULE BY MOUTH EVERY DAY   TRELEGY ELLIPTA  200-62.5-25 MCG/ACT AEPB TAKE 1 PUFF BY MOUTH EVERY DAY   amLODipine  (NORVASC ) 5 MG tablet Take 1 tablet (5 mg total) by mouth daily.   No facility-administered encounter medications on file as of 07/24/2024.    Review of Systems:  Review of Systems  Constitutional:  Negative for activity change, appetite change and unexpected weight change.  HENT: Negative.    Respiratory:  Negative for cough and shortness of breath.   Cardiovascular:  Negative for leg swelling.  Gastrointestinal:  Negative for constipation.  Genitourinary:  Negative for frequency.  Musculoskeletal:  Positive for back pain. Negative for arthralgias, gait problem and myalgias.  Skin: Negative.  Negative for rash.  Neurological:  Negative for dizziness and weakness.  Psychiatric/Behavioral:  Negative for confusion and sleep disturbance.   All other systems reviewed and are negative.   Health Maintenance  Topic Date Due   Diabetic kidney evaluation - Urine ACR  Never done   DTaP/Tdap/Td (1 - Tdap) Never done   FOOT EXAM  01/28/2023   OPHTHALMOLOGY EXAM  02/26/2024   HEMOGLOBIN A1C  06/16/2024   Medicare Annual Wellness (AWV)  11/13/2024   COVID-19 Vaccine (10 - 2025-26 season) 12/31/2024   Diabetic kidney evaluation - eGFR measurement  06/19/2025   Pneumococcal Vaccine: 50+ Years  Completed   Influenza Vaccine  Completed   Zoster Vaccines- Shingrix   Completed   Meningococcal B Vaccine  Aged Out    Physical Exam: Vitals:   07/24/24 0909  BP: (!) 120/98  Pulse: 62  Temp: 97.8 F (36.6 C)  SpO2: 93%  Weight: 205 lb 12.8 oz (93.4 kg)  Height: 5' 9 (1.753 m)   Body mass index is 30.39 kg/m. Physical Exam  Labs reviewed: Basic Metabolic Panel: Recent Labs    12/15/23 0000 05/01/24 1034 05/03/24 0755 06/19/24 0000  NA 148* 149*  --  147  K 4.6 3.9  --  3.9  CL 106 108  --  108  CO2 26* 17  --  24*  BUN 24* 30*  --  21  CREATININE 1.1 1.1 0.90 0.9   CALCIUM  9.8 9.7  --  10.0  TSH  --   --   --  2.55   Liver Function Tests: Recent Labs    12/15/23 0000 05/01/24 1034 06/19/24 0000  AST 31 30 23   ALT 45* 41* 35  ALKPHOS 106 111 102  ALBUMIN 4.6 4.3 4.2   No results for input(s): LIPASE, AMYLASE in the last 8760 hours. No results for input(s): AMMONIA in the last 8760 hours. CBC: Recent Labs    12/15/23 0000 05/01/24 1034 06/19/24 0000  WBC 10.7 8.7 8.6  NEUTROABS  --  5.20  --   HGB 18.2* 11.2* 17.2  HCT 54* 51 52  PLT 175 155 149*   Lipid Panel: Recent Labs  05/01/24 1034 06/19/24 0000  CHOL 114 116  HDL 27* 29*  LDLCALC  --  24  TRIG 425* 315*   Lab Results  Component Value Date   HGBA1C 7.1 12/15/2023    Procedures since last visit: MR ABDOMEN WWO CONTRAST Result Date: 07/05/2024 CLINICAL DATA:  Follow-up of renal cysts. EXAM: MRI ABDOMEN WITHOUT AND WITH CONTRAST TECHNIQUE: Multiplanar multisequence MR imaging of the abdomen was performed both before and after the administration of intravenous contrast. CONTRAST:  9 mL of Vueway. COMPARISON:  MRI abdomen from 06/30/2022. FINDINGS: Lower chest: There is a 2.2 x 3.0 cm stable opacity in the left lung lower lobe. Otherwise, unremarkable MR appearance to the lung bases. No pleural effusion. No pericardial effusion. Normal heart size. Hepatobiliary: Liver is mildly enlarged. Noncirrhotic configuration. There is a bilobed 2.0 x 2.3 cm simple cyst in the left hepatic lobe, segment 3. There is moderate diffuse hepatic steatosis. No intrahepatic or extrahepatic bile duct dilatation. No choledocholithiasis. Status post cholecystectomy. Pancreas: No mass, inflammatory changes or other parenchymal abnormality identified. No main pancreatic duct dilation. Spleen: There is an approximately 6 x 9 mm, T2 hyperintense nonenhancing area in the pancreatic neck region. The communication of this lesion with main pancreatic duct is not well evaluated however, main pancreatic  duct is not dilated. 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 The pancreas is otherwise unremarkable. No suspicious lesion. No peripancreatic fat stranding. Adrenals/Urinary Tract: Unremarkable adrenal glands. No hydroureteronephrosis. Redemonstration of multiple focal renal lesions, described as follows: *There is a partially exophytic T1 hyperintense and T2 hypointense lesion arising from the right kidney lower pole, anteriorly measuring 2.7 x 3.0 cm. There is no enhancement on the postcontrast images. This is compatible with proteinaceous/hemorrhagic cyst. The lesion previously measured 2.5 x 2.8 cm. There is minimal interval growth without development of aggressive features. *There is a multilobulated partially exophytic 2.4 x 2.5 cm T1 hypointense and T2 hyperintense lesion arising from the left kidney lower pole, anterolaterally. The lesion exhibits 2-3 minimally thickened enhancing septations measuring up to 2.5-3 mm in thickness. The lesion previously measured 2.2 x 2.2 cm, when remeasured in similar fashion. This is a Bosniak category 67F cyst. *There is a partially exophytic 2.0 x 2.2 cm heterogeneous, T1 hyperintense and T2 hypointense nonenhancing lesion arising from the left kidney upper pole, anterolaterally, favored to represent a proteinaceous/hemorrhagic cysts. No significant interval change in size. *There is a partially exophytic 4.1 x 5.4 cm cyst arising from the left kidney interpolar region, laterally. There are additional several other smaller simple renal cysts including arising from the right kidney upper pole. Stomach/Bowel: Visualized portions within the abdomen are unremarkable. No disproportionate dilation of bowel loops. Vascular/Lymphatic: No pathologically enlarged lymph nodes identified. No abdominal aortic aneurysm demonstrated. No ascites. Other:   None. Musculoskeletal: No suspicious bone lesions identified. IMPRESSION: 1. There is a 2.4 x 2.5 cm Bosniak category 67F cyst arising from the left kidney lower pole, anterolaterally. There is minimal interval increase in the size of the lesion without development of aggressive features. 2. There is a 2.7 x 3.0 cm proteinaceous/hemorrhagic cyst arising from the right kidney lower pole. There is minimal interval increase in the size of the lesion without development of aggressive features. 3. There is a 2.0 x 2.2 cm proteinaceous/hemorrhagic cyst arising from the left kidney upper pole. No significant interval change in size. 4. 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 No suspicious features. Follow-up exam in 2 years is recommended. 5. Multiple other bilateral simple renal cysts. 6. Moderate diffuse hepatic steatosis. Electronically Signed   By: Ree Molt M.D.   On: 07/05/2024 14:02    Assessment/Plan   Assessment and Plan    CAD s/p Cath Confirmed blockages with collateral circulation compensating. On statin  Heart failure with reduced ejection fraction Ejection fraction at 45-50%, decreased from 55-60%.  - Continue Entresto .   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 Weight loss noted. Jardiance recommended but delayed due to Entresto  adjustment. - Continue current diabetes management. - Repeat A1c and lipid panel in one month. - Consider starting Jardiance if A1c remains high after Entresto  adjustment. - Continue follow-up with retina specialist.  Chronic obstructive pulmonary disease with hypoxemia   Managed with Trelegy, improved symptoms.   Oxygen saturation drops upon waking, improves with deep breathing. Elevated hemoglobin suggests possible inadequate oxygenation  during sleep. - Continue Trelegy. - Monitor oxygen saturation and hemoglobin levels. - Discuss potential need for home sleep test with pulmonologist.  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 Previous treatments ineffective.   SABRA Possible stenosis or herniated disc. - Refer to [facility] as per his request for evaluation and possible steroid injections.   Pulmonary nodule, stable, under surveillance Stable pulmonary nodule with no changes. - Continue surveillance with pulmonologist. BPH Proscar  and Flomax  Follows with Urology Annually  Primary hypertension Norvasc  and hydrochlorothiazide   Also on Entresto  now  General Health Maintenance Up to date on vaccinations except tetanus. Regular follow-up with specialists. - Administer tetanus vaccination when convenient. - Continue regular follow-up with specialists.       Labs/tests ordered:  CBC,A1C and Lipids in 4 weeks Next appt:  09/25/2024

## 2024-07-26 ENCOUNTER — Encounter (HOSPITAL_BASED_OUTPATIENT_CLINIC_OR_DEPARTMENT_OTHER): Payer: Self-pay

## 2024-07-30 ENCOUNTER — Other Ambulatory Visit: Payer: Self-pay | Admitting: Internal Medicine

## 2024-07-30 ENCOUNTER — Encounter: Payer: Self-pay | Admitting: Internal Medicine

## 2024-07-30 DIAGNOSIS — G8929 Other chronic pain: Secondary | ICD-10-CM

## 2024-07-30 NOTE — Progress Notes (Signed)
 or

## 2024-08-10 ENCOUNTER — Other Ambulatory Visit: Payer: Self-pay | Admitting: Internal Medicine

## 2024-09-21 LAB — HEMOGLOBIN A1C: Hemoglobin A1C: 7.6

## 2024-09-21 LAB — LIPID PANEL
Cholesterol: 116 (ref 0–200)
HDL: 32 — AB (ref 35–70)
LDL Cholesterol: 15
LDl/HDL Ratio: 3.6
Triglycerides: 344 — AB (ref 40–160)

## 2024-09-21 LAB — CBC AND DIFFERENTIAL
HCT: 50 (ref 41–53)
Hemoglobin: 16.8 (ref 13.5–17.5)
Platelets: 170 K/uL (ref 150–400)
WBC: 9.8

## 2024-09-21 LAB — CBC: RBC: 5.51 — AB (ref 3.87–5.11)

## 2024-09-24 ENCOUNTER — Other Ambulatory Visit: Payer: Self-pay | Admitting: Internal Medicine

## 2024-09-25 ENCOUNTER — Encounter: Payer: Self-pay | Admitting: Internal Medicine

## 2024-09-25 ENCOUNTER — Non-Acute Institutional Stay: Payer: Self-pay | Admitting: Internal Medicine

## 2024-09-25 VITALS — BP 120/88 | HR 88 | Temp 97.9°F | Ht 69.0 in | Wt 210.8 lb

## 2024-09-25 DIAGNOSIS — G8929 Other chronic pain: Secondary | ICD-10-CM

## 2024-09-25 DIAGNOSIS — M5442 Lumbago with sciatica, left side: Secondary | ICD-10-CM | POA: Diagnosis not present

## 2024-09-25 DIAGNOSIS — M1990 Unspecified osteoarthritis, unspecified site: Secondary | ICD-10-CM

## 2024-09-25 DIAGNOSIS — J449 Chronic obstructive pulmonary disease, unspecified: Secondary | ICD-10-CM

## 2024-09-25 DIAGNOSIS — G4733 Obstructive sleep apnea (adult) (pediatric): Secondary | ICD-10-CM | POA: Diagnosis not present

## 2024-09-25 DIAGNOSIS — G4734 Idiopathic sleep related nonobstructive alveolar hypoventilation: Secondary | ICD-10-CM | POA: Diagnosis not present

## 2024-09-25 DIAGNOSIS — N401 Enlarged prostate with lower urinary tract symptoms: Secondary | ICD-10-CM | POA: Diagnosis not present

## 2024-09-25 DIAGNOSIS — I10 Essential (primary) hypertension: Secondary | ICD-10-CM | POA: Diagnosis not present

## 2024-09-25 DIAGNOSIS — N400 Enlarged prostate without lower urinary tract symptoms: Secondary | ICD-10-CM

## 2024-09-25 DIAGNOSIS — E1165 Type 2 diabetes mellitus with hyperglycemia: Secondary | ICD-10-CM | POA: Diagnosis not present

## 2024-09-25 DIAGNOSIS — Q6102 Congenital multiple renal cysts: Secondary | ICD-10-CM

## 2024-09-25 DIAGNOSIS — R300 Dysuria: Secondary | ICD-10-CM

## 2024-09-25 DIAGNOSIS — E7800 Pure hypercholesterolemia, unspecified: Secondary | ICD-10-CM | POA: Diagnosis not present

## 2024-09-25 DIAGNOSIS — R911 Solitary pulmonary nodule: Secondary | ICD-10-CM

## 2024-09-25 MED ORDER — ASPIRIN 81 MG PO CHEW: 81.0000 mg | CHEWABLE_TABLET | Freq: Every day | ORAL | Status: AC

## 2024-09-25 MED ORDER — TRELEGY ELLIPTA 200-62.5-25 MCG/ACT IN AEPB: 1.0000 | INHALATION_SPRAY | Freq: Every day | RESPIRATORY_TRACT | 3 refills | Status: AC

## 2024-09-25 NOTE — Progress Notes (Signed)
 "  Location:  Medical Illustrator of Service:  Clinic (12)  Provider:   Code Status:  Goals of Care:     05/09/2024    6:57 AM  Advanced Directives  Does Patient Have a Medical Advance Directive? Yes  Type of Estate Agent of [place];Living will  Does patient want to make changes to medical advance directive? No - Patient declined  Copy of Healthcare Power of Attorney in Chart? No - copy requested     Chief Complaint  Patient presents with   Follow-up    2 month follow up    HPI: Patient is a 83 y.o. male seen today for medical management of chronic diseases.    Lives in [place] in [place] with his wife     Back Pain Seen by Dr Duwayne MRI show Severe Spinal stenosis Dr Bonner Injected He feels that has helped Takes Advil Rpn CAD Recent Had Cardiac Cath due to decrease in EF   He has coronary artery disease with two blockages identified on heart catheterization. No intervention was performed due to adequate collateral circulation ejection fraction decreased from 55-60% to 45-50% on the last echocardiogram.  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 INVASIVE CV LAB;  Service: Cardiovascular;  Laterality: N/A;   TONSILLECTOMY      Allergies[1]  Outpatient Encounter Medications as of 09/25/2024  Medication Sig   amLODipine  (NORVASC ) 5 MG tablet Take 1 tablet (5 mg total) by mouth daily.   aspirin  81 MG chewable tablet Chew 1 tablet (81 mg total) by mouth daily.   atorvastatin  (LIPITOR) 40 MG tablet Take 1 tablet (40 mg total) by mouth daily.   FIBER PO Take 1 Dose by mouth daily.   finasteride  (PROSCAR ) 5 MG tablet TAKE 1 TABLET BY MOUTH EVERY OTHER DAY   hydrochlorothiazide  (MICROZIDE ) 12.5 MG capsule TAKE 1 CAPSULE BY MOUTH EVERY DAY   metFORMIN  (GLUCOPHAGE ) 500 MG tablet TAKE 1 TABLET BY MOUTH TWICE A DAY WITH FOOD   Multiple Vitamins-Minerals (PRESERVISION AREDS 2) CAPS Take 1 capsule by mouth 2 (two) times daily.   OXYGEN Inhale 4 L into the lungs at bedtime.   sacubitril -valsartan  (ENTRESTO ) 97-103 MG Take 1 tablet by mouth 2 (two) times daily.   tamsulosin  (FLOMAX ) 0.4 MG CAPS capsule TAKE 1 CAPSULE BY MOUTH EVERY DAY   [DISCONTINUED] TRELEGY ELLIPTA  200-62.5-25 MCG/ACT AEPB INHALE 1 PUFF BY MOUTH EVERY DAY   Fluticasone -Umeclidin-Vilant (TRELEGY ELLIPTA ) 200-62.5-25 MCG/ACT AEPB Inhale  1 puff into the lungs daily.   No facility-administered encounter medications on file as of 09/25/2024.    Review of Systems:  Review of Systems  Constitutional:  Negative for activity change, appetite change and unexpected weight change.  HENT: Negative.    Respiratory:  Negative for cough and shortness of breath.   Cardiovascular:  Negative for leg swelling.  Gastrointestinal:  Negative for constipation.  Genitourinary:  Negative for frequency.  Musculoskeletal:  Positive for back pain and gait problem. Negative for arthralgias and myalgias.  Skin: Negative.  Negative for rash.  Neurological:  Negative for  dizziness and weakness.  Psychiatric/Behavioral:  Negative for confusion and sleep disturbance.   All other systems reviewed and are negative.   Health Maintenance  Topic Date Due   Diabetic kidney evaluation - Urine ACR  Never done   DTaP/Tdap/Td (1 - Tdap) Never done   FOOT EXAM  01/28/2023   OPHTHALMOLOGY EXAM  02/26/2024   HEMOGLOBIN A1C  06/16/2024   Medicare Annual Wellness (AWV)  11/13/2024   COVID-19 Vaccine (10 - 2025-26 season) 12/31/2024   Diabetic kidney evaluation - eGFR measurement  06/19/2025   Pneumococcal Vaccine: 50+ Years  Completed   Influenza Vaccine  Completed   Zoster Vaccines- Shingrix   Completed   Meningococcal B Vaccine  Aged Out    Physical Exam: Vitals:   09/25/24 0914  BP: 120/88  Pulse: 88  Temp: 97.9 F (36.6 C)  SpO2: 93%  Weight: 210 lb 12.8 oz (95.6 kg)  Height: 5' 9 (1.753 m)   Body mass index is 31.13 kg/m. Physical Exam Vitals reviewed.  Constitutional:      Appearance: Normal appearance.  HENT:     Head: Normocephalic.     Nose: Nose normal.     Mouth/Throat:     Mouth: Mucous membranes are moist.     Pharynx: Oropharynx is clear.  Eyes:     Pupils: Pupils are equal, round, and reactive to light.  Cardiovascular:     Rate and Rhythm: Normal rate and regular rhythm.     Pulses: Normal pulses.     Heart sounds: No murmur heard. Pulmonary:     Effort: Pulmonary effort is normal. No respiratory distress.     Breath sounds: Normal breath sounds. No rales.  Abdominal:     General: Abdomen is flat. Bowel sounds are normal.     Palpations: Abdomen is soft.  Musculoskeletal:        General: No swelling.     Cervical back: Neck supple.  Skin:    General: Skin is warm.  Neurological:     General: No focal deficit present.     Mental Status: He is alert and oriented to person, place, and time.  Psychiatric:        Mood and Affect: Mood normal.        Thought Content: Thought content normal.     Labs reviewed: Basic  Metabolic Panel: Recent Labs    12/15/23 0000 05/01/24 1034 05/03/24 0755 06/19/24 0000  NA 148* 149*  --  147  K 4.6 3.9  --  3.9  CL 106 108  --  108  CO2 26* 17  --  24*  BUN 24* 30*  --  21  CREATININE 1.1 1.1 0.90 0.9  CALCIUM  9.8 9.7  --  10.0  TSH  --   --   --  2.55   Liver Function Tests: Recent Labs    12/15/23 0000 05/01/24 1034 06/19/24  0000  AST 31 30 23   ALT 45* 41* 35  ALKPHOS 106 111 102  ALBUMIN 4.6 4.3 4.2   No results for input(s): LIPASE, AMYLASE in the last 8760 hours. No results for input(s): AMMONIA in the last 8760 hours. CBC: Recent Labs    12/15/23 0000 05/01/24 1034 06/19/24 0000  WBC 10.7 8.7 8.6  NEUTROABS  --  5.20  --   HGB 18.2* 11.2* 17.2  HCT 54* 51 52  PLT 175 155 149*   Lipid Panel: Recent Labs    05/01/24 1034 06/19/24 0000  CHOL 114 116  HDL 27* 29*  LDLCALC  --  24  TRIG 425* 315*   Lab Results  Component Value Date   HGBA1C 7.1 12/15/2023    Procedures since last visit: No results found.  Assessment/Plan 1. Type 2 diabetes mellitus with hyperglycemia, without long-term current use of insulin (HCC) (Primary) On Metformin  GLP and Jardinace discussed Repeat A1C pending  2. Chronic bilateral low back pain with bilateral sciatica Better still uses Aleve PRn  3. Chronic obstructive pulmonary disease, unspecified COPD type (HCC) Doing well on Trelegy  4. Pure hypercholesterolemia On statin  5. Obstructive sleep apnea syndrome CPAP  6. 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 - Continue surveillance with pulmonologist. 10 CAD Start on baby aspirin  already on statin Labs/tests ordered:  Labs pending Next appt:  12/25/2024          [1]  Allergies Allergen Reactions    Sulfa Antibiotics Other (See Comments)    childhood reaction   "

## 2024-10-16 ENCOUNTER — Encounter: Payer: Self-pay | Admitting: Internal Medicine

## 2024-11-05 ENCOUNTER — Other Ambulatory Visit: Payer: Self-pay | Admitting: Internal Medicine

## 2024-12-25 ENCOUNTER — Encounter: Admitting: Internal Medicine

## 2025-01-24 ENCOUNTER — Ambulatory Visit (HOSPITAL_BASED_OUTPATIENT_CLINIC_OR_DEPARTMENT_OTHER): Admitting: Pulmonary Disease
